# Patient Record
Sex: Male | Born: 1950 | Race: White | Hispanic: No | Marital: Married | Smoking: Never smoker
Health system: Southern US, Community
[De-identification: ages and names within clinical notes are randomized; demographics above are authoritative.]

## PROBLEM LIST (undated history)

## (undated) DIAGNOSIS — E7800 Pure hypercholesterolemia, unspecified: Secondary | ICD-10-CM

## (undated) DIAGNOSIS — E119 Type 2 diabetes mellitus without complications: Secondary | ICD-10-CM

## (undated) DIAGNOSIS — I10 Essential (primary) hypertension: Secondary | ICD-10-CM

## (undated) HISTORY — PX: WISDOM TOOTH EXTRACTION: SHX21

---

## 2004-05-24 ENCOUNTER — Ambulatory Visit: Payer: Self-pay | Admitting: Otolaryngology

## 2004-05-28 ENCOUNTER — Ambulatory Visit: Payer: Self-pay | Admitting: Otolaryngology

## 2004-06-14 ENCOUNTER — Ambulatory Visit: Payer: Self-pay | Admitting: Surgery

## 2004-06-23 ENCOUNTER — Ambulatory Visit: Payer: Self-pay | Admitting: Otolaryngology

## 2006-07-12 ENCOUNTER — Emergency Department: Payer: Self-pay | Admitting: Emergency Medicine

## 2011-09-18 ENCOUNTER — Ambulatory Visit: Payer: Self-pay | Admitting: Surgery

## 2012-08-21 ENCOUNTER — Ambulatory Visit: Payer: Self-pay | Admitting: Family Medicine

## 2012-08-24 ENCOUNTER — Ambulatory Visit: Payer: Self-pay | Admitting: Specialist

## 2012-08-27 ENCOUNTER — Inpatient Hospital Stay: Payer: Self-pay | Admitting: Internal Medicine

## 2012-08-27 LAB — URINALYSIS, COMPLETE
Bacteria: NONE SEEN
Bilirubin,UR: NEGATIVE
Blood: NEGATIVE
Ketone: NEGATIVE
Nitrite: NEGATIVE
Ph: 5 (ref 4.5–8.0)
Protein: 100
Specific Gravity: 1.03 (ref 1.003–1.030)
WBC UR: 5 /HPF (ref 0–5)

## 2012-08-27 LAB — BASIC METABOLIC PANEL
Anion Gap: 6 — ABNORMAL LOW (ref 7–16)
BUN: 18 mg/dL (ref 7–18)
Calcium, Total: 9.3 mg/dL (ref 8.5–10.1)
Chloride: 99 mmol/L (ref 98–107)
Creatinine: 1.14 mg/dL (ref 0.60–1.30)
EGFR (African American): >60
EGFR (Non-African Amer.): >60
Glucose: 198 mg/dL — ABNORMAL HIGH (ref 65–99)
Osmolality: 274 (ref 275–301)
Sodium: 133 mmol/L — ABNORMAL LOW (ref 136–145)

## 2012-08-27 LAB — CBC WITH DIFFERENTIAL/PLATELET
Basophil #: 0.1 10*3/uL (ref 0.0–0.1)
Basophil %: 0.6 %
Eosinophil %: 0.1 %
HCT: 37.9 % — ABNORMAL LOW (ref 40.0–52.0)
HGB: 13.2 g/dL (ref 13.0–18.0)
Lymphocyte #: 1.4 10*3/uL (ref 1.0–3.6)
Lymphocyte %: 9.6 %
Monocyte #: 0.9 x10 3/mm (ref 0.2–1.0)
Monocyte %: 6.3 %
Neutrophil #: 12.5 10*3/uL — ABNORMAL HIGH (ref 1.4–6.5)
Neutrophil %: 83.4 %
RBC: 4.53 10*6/uL (ref 4.40–5.90)
WBC: 15 10*3/uL — ABNORMAL HIGH (ref 3.8–10.6)

## 2012-08-27 LAB — HEMOGLOBIN A1C: Hemoglobin A1C: 7.2 % — ABNORMAL HIGH (ref 4.2–6.3)

## 2012-08-29 LAB — BASIC METABOLIC PANEL
BUN: 18 mg/dL (ref 7–18)
Chloride: 104 mmol/L (ref 98–107)
Co2: 26 mmol/L (ref 21–32)
EGFR (African American): >60
Glucose: 293 mg/dL — ABNORMAL HIGH (ref 65–99)
Osmolality: 287 (ref 275–301)
Sodium: 137 mmol/L (ref 136–145)

## 2012-08-29 LAB — CBC WITH DIFFERENTIAL/PLATELET
Basophil #: 0 10*3/uL (ref 0.0–0.1)
Basophil %: 0.2 %
HCT: 35.2 % — ABNORMAL LOW (ref 40.0–52.0)
HGB: 12.3 g/dL — ABNORMAL LOW (ref 13.0–18.0)
Lymphocyte #: 0.8 10*3/uL — ABNORMAL LOW (ref 1.0–3.6)
MCH: 29.3 pg (ref 26.0–34.0)
Monocyte %: 5.8 %
Neutrophil %: 87.9 %
Platelet: 277 10*3/uL (ref 150–440)
RDW: 14.1 % (ref 11.5–14.5)
WBC: 12.4 10*3/uL — ABNORMAL HIGH (ref 3.8–10.6)

## 2012-08-30 LAB — EXPECTORATED SPUTUM ASSESSMENT W GRAM STAIN, RFLX TO RESP C

## 2012-09-02 LAB — CULTURE, BLOOD (SINGLE)

## 2012-09-16 ENCOUNTER — Ambulatory Visit: Payer: Self-pay | Admitting: Family Medicine

## 2012-09-18 ENCOUNTER — Ambulatory Visit: Payer: Self-pay | Admitting: Specialist

## 2012-11-28 ENCOUNTER — Ambulatory Visit: Payer: Self-pay

## 2013-02-16 ENCOUNTER — Ambulatory Visit: Payer: Self-pay | Admitting: Internal Medicine

## 2013-08-01 ENCOUNTER — Ambulatory Visit: Payer: Self-pay | Admitting: Internal Medicine

## 2014-01-10 IMAGING — CR DG CHEST 2V
1 series · 2 of 2 positions shown · non-contrast
Comparison: none

REASON FOR EXAM: pneumonia
COMMENTS:

PROCEDURE:     KDR - KDXR CHEST PA (OR AP) AND LAT  - September 18, 2012 [DATE]
RESULT:     Comparison: 08/27/2012

[Series 1: pa · 0.17mm/px · 2 of 2 slices shown]
[im 1/2]
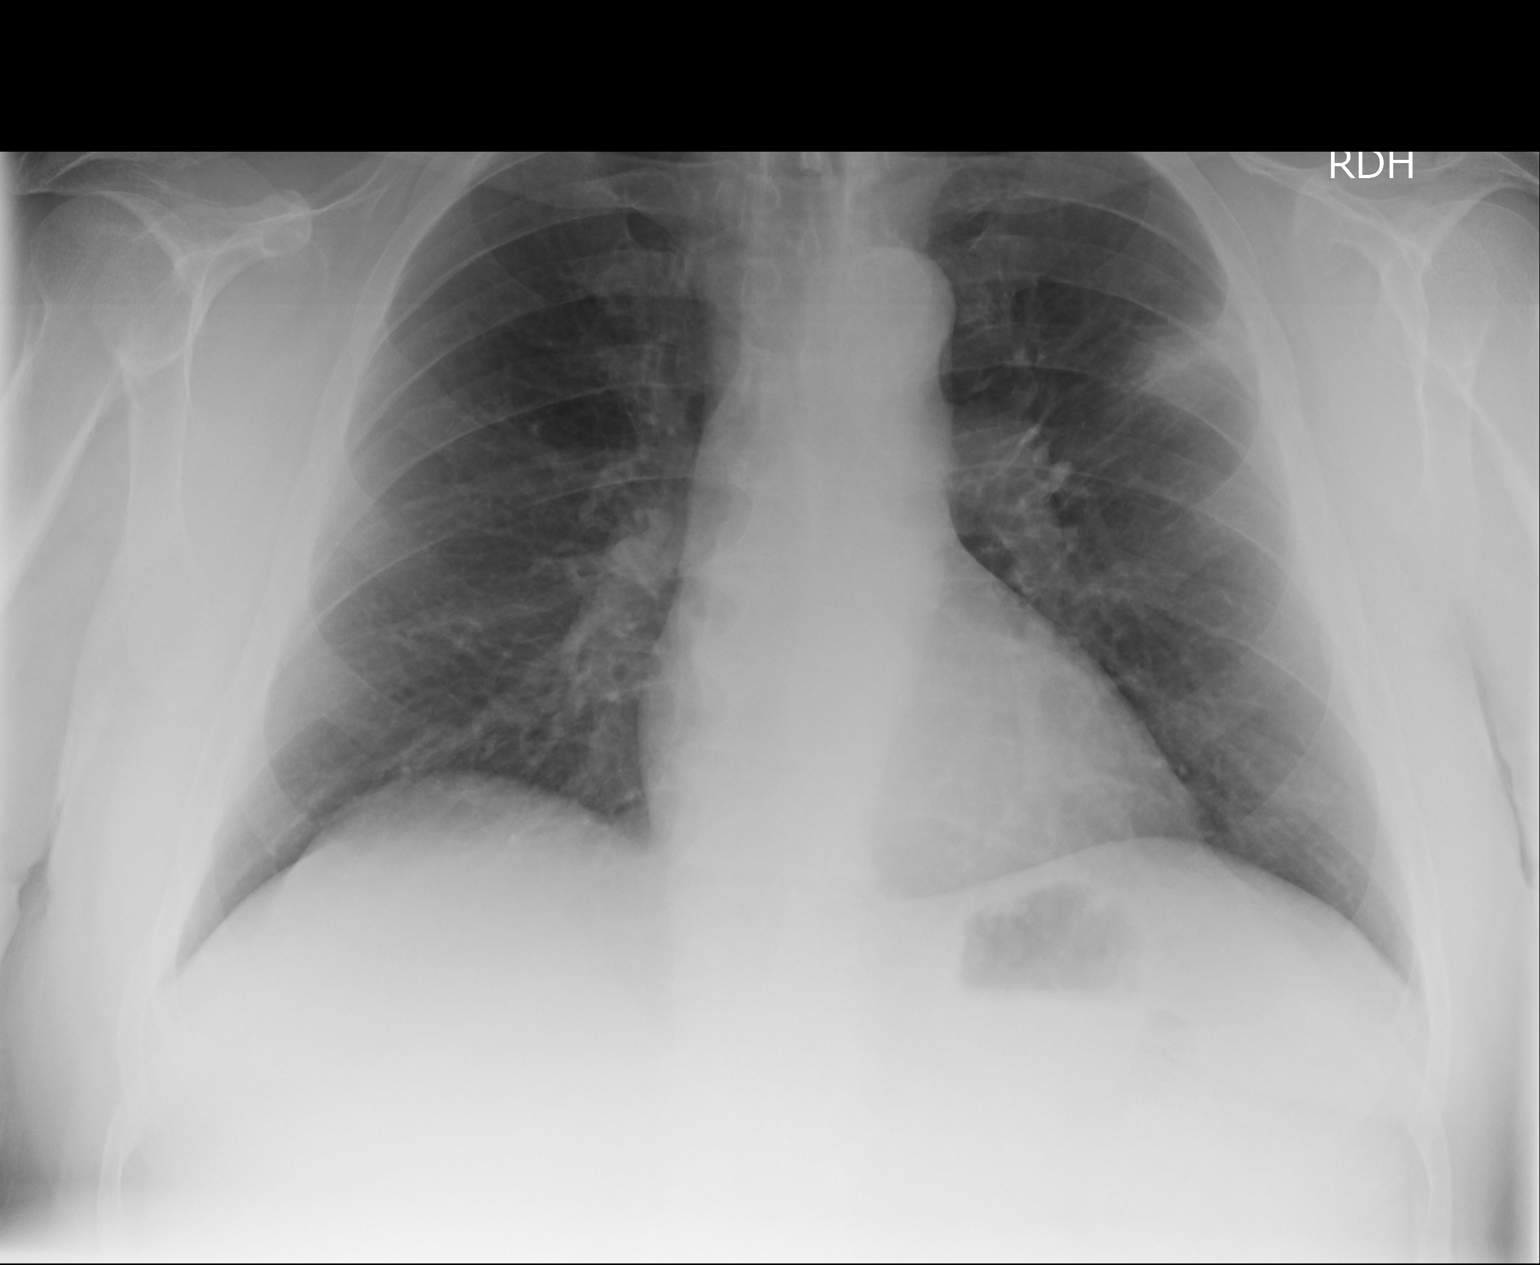
[im 2/2]
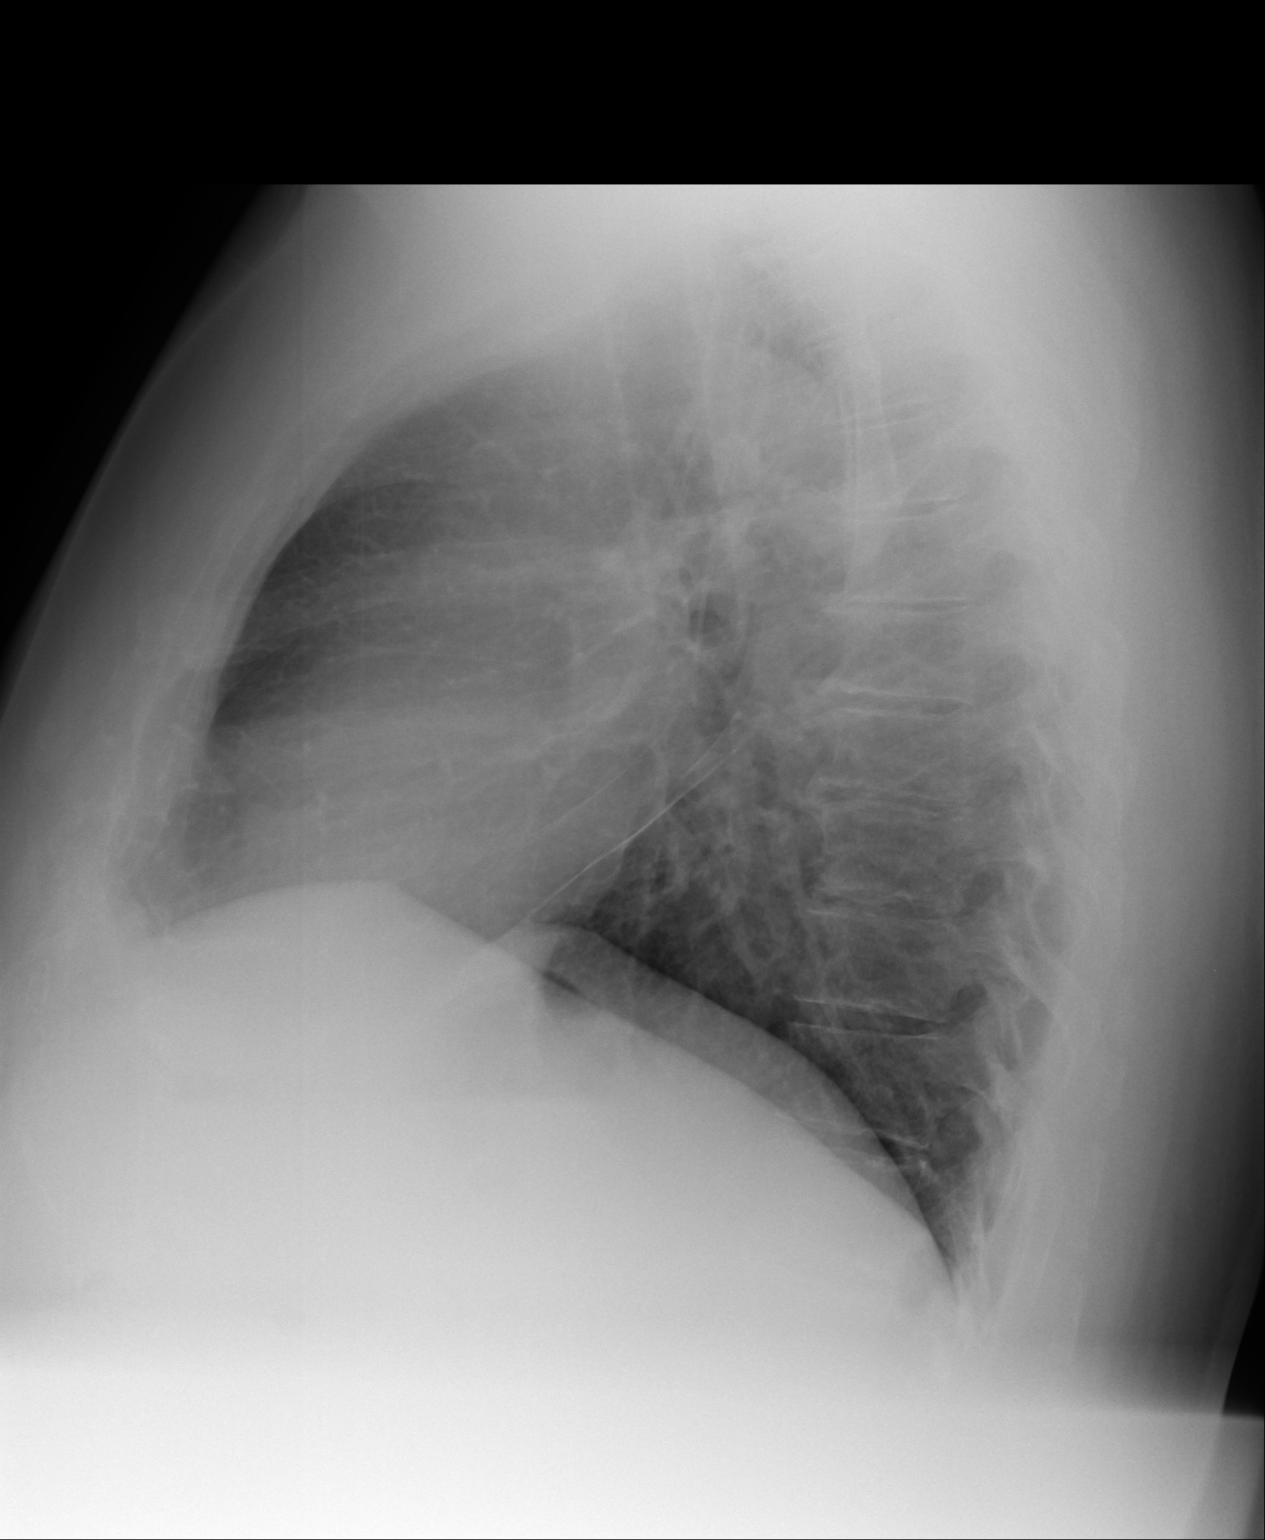

[2 of 2 positions shown; findings below may reference images not displayed]

FINDINGS: PA and lateral chest radiographs are provided. There is persistent
peripheral left upper lobe airspace disease which has decreased in size
compared with the prior examination. There is no other focal parenchymal
opacity, pleural effusion, or pneumothorax. The heart and mediastinum are
unremarkable.  The osseous structures are unremarkable.
IMPRESSION: Persistent, but improved, left upper lobe peripheral airspace disease. This
likely reflects resolving pneumonia. Recommend followup radiography to
document complete resolution following adequate medical therapy. If there is
not complete resolution, then recommend further evaluation with CT of the
chest to exclude underlying pathology.

[REDACTED]

## 2014-05-09 ENCOUNTER — Ambulatory Visit: Payer: Self-pay

## 2014-08-19 NOTE — Consult Note (Signed)
Impression:    63yo male w/ h/o DM and sleep apnea admitted with pneumonia.     He has been in and out of the office for the past week.  He had a negative CXR several days ago, but now his CXR shows an infiltrate.  His CT shows no evidence for PE.  He is feeling somewhat better.  Still with a bit of wheezing.    Continue steroids.  Would wean when possible.    He is on ceftriaxone and levofloxacin.  The latter would be adequate to cover CAP.    Will try to get sputum culture.  Electronic Signatures: Jeslin Bazinet MPH, Heinz Knuckles (MD)  (Signed on 02-May-14 11:15)  Authored  Last Updated: 02-May-14 11:15 by Shyteria Lewis MPH, Heinz Knuckles (MD)

## 2014-08-19 NOTE — H&P (Signed)
PATIENT NAME:  Stanley Myers, Stanley Myers MR#:  [id number] DATE OF BIRTH:  1950/08/30  DATE OF ADMISSION:  08/27/2012  ADMITTING PHYSICIAN: Gladstone Lighter, MD  PRIMARY CARE PHYSICIAN: Lanae Boast, MD   CHIEF COMPLAINT: Difficulty breathing.   HISTORY OF PRESENT ILLNESS: The patient  is a very pleasant 64 year old Caucasian male with past medical history significant for obesity, obstructive sleep apnea, hyperlipidemia, hypertension and diabetes mellitus, who has been having upper respiratory tract infection symptoms for the past 10 days.  He went to urgent care last week because of nonresolving postnasal drip with cough and difficulty breathing and was discharged on an Albuterol inhaler and cough medicine. Since he was not getting better, he visited his PCP, who was started him on p.o. prednisone, added some nebs instead of the inhaler and also added an antibiotic. He went for follow up today because his symptoms were not getting better, and he also developed severe left-sided pleuritic chest plain and was very dyspneic, continued to have cough and also fevers as high as 103 Fahrenheit yesterday at home and so was referred to the hospital.   PAST MEDICAL HISTORY: 1.  Obstructive sleep apnea.  2.  Hyperlipidemia.  3.  Osteoarthritis.  4.  Type 2 diabetes mellitus.  5.  Hypertension.  6.  Allergic rhinitis.  7.  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  8.  Erectile dysfunction.   PAST SURGICAL HISTORY: 1.  Skin pre-malignant lesion excision. 2.  Colonoscopy.   ALLERGIES:  INTOLERANT TO LIPITOR.   HOME MEDICATIONS:  1. Albuterol nebulizer q. 6 hours p.r.n. started 2 days ago.  2. Aspirin 81 mg p.o. daily.  3. Antibiotic Cedax 400 mg capsule once a day started 2 days ago.  4. Celebrex 200 mg b.i.d. as needed for arthritis pain.  5. Fenofibrate 145 mg p.o. daily.  6. Glipizide 5 mg p.o. daily.  7. Losartan HCTZ 100/25 mg 1 tablet p.o. daily.  8. Metformin 1000 mg p.o. b.i.d.  9. Singulair 10 mg  p.o. daily.  10. Prednisone taper, currently at 30 mg, again started 2 days ago.  11. Crestor 10 mg p.o. daily.  12. Cialis 5 mg p.o. daily.  13. Ultracet 37.5/325 mg 1 tablet every 6 hours as needed for pain.   SOCIAL HISTORY: Lives at home with his wife. No smoking.  No alcohol use. Currently retired.   FAMILY HISTORY: Mother had history of breast cancer but died in her 87s secondary to sepsis.  Father died in 63s from stroke.   REVIEW OF SYSTEMS:   CONSTITUTIONAL: Positive for fever, fatigue and weakness.  EYES: No blurred vision, double vision, glaucoma, inflammation or cataracts.  ENT: Positive for right ear hearing loss. No tinnitus. No discharge.  Positive for postnasal drip. No epistaxis.  RESPIRATORY: Positive for cough, wheeze.  No hemoptysis. No chronic obstructive pulmonary disease.  CARDIOVASCULAR:  Positive for pleuritic left-sided chest pain, dyspnea on exertion, no palpitations or syncope.  GASTROINTESTINAL: No nausea, vomiting, diarrhea, abdominal pain, hematemesis or melena.  GENITOURINARY: No dysuria, hematuria, renal calculus, frequency or incontinence.  ENDOCRINE: No polyuria, nocturia, thyroid problems, heat or cold intolerance.  HEMATOLOGY: No anemia, easy bruising or bleeding.  SKIN: No acne, rash or lesions.  MUSCULOSKELETAL: No neck, back, shoulder pain, arthritis or gout.  NEUROLOGIC: No numbness, weakness, CVA, TIA or seizures.   PSYCHOLOGICAL: No anxiety, insomnia, depression.   PHYSICAL EXAMINATION: VITAL SIGNS: Temperature 97.9 degrees Fahrenheit, pulse 86, respirations 17, blood pressure 126/77, pulse of 98% on room air.  GENERAL: Heavily built, well-nourished male sitting in chair, not in any acute distress. Unable to lie flat for his CAT scan due to dyspnea.  HEENT: Normocephalic, atraumatic. Pupils are equal, round, reacting to light. Anicteric sclerae. Extraocular movements intact. Oropharynx clear without erythema, mass or exudates.  NECK: Supple. No  thyromegaly, JVD or carotid bruits. No lymphadenopathy.  LUNGS: Moving air bilaterally but diffuse inspiratory-expiratory wheeze, more prominent on the left side. There are no crackles. Use of accessory muscles on minimal exertion.  CARDIOVASCULAR: S1, S2, regular rate and rhythm. No murmurs, rubs or gallops.  ABDOMEN: Obese, nontender, nondistended. No hepatosplenomegaly.  Normal bowel sounds.  EXTREMITIES: No pedal edema. No clubbing or cyanosis, 2+ dorsalis pedis pulses palpable bilaterally.  SKIN: No acne, rash or lesions.  LYMPHATICS: No cervical lymphadenopathy.  NEUROLOGIC: Cranial nerves intact. No focal motor or sensory deficits.  PSYCHOLOGICAL: The patient is awake, alert, oriented x 3.   LABORATORY AND RADIOLOGICAL DATA:  WBC 15.0, hemoglobin 13.3, hematocrit 37.9, platelet count 218.  Sodium 132, potassium 3.6, chloride 99, bicarbonate 28, BUN 18, creatinine 1.14, glucose of 198 and calcium of 9.3. Urinalysis negative for any infection.  Chest x-ray is done and pending at this time.   Hemoglobin A1c  is elevated at 7.2.  His chest x-ray done on 08/25/2011 is showing increased perihilar markings, could be atelectasis, also seen in bronchitis.   ASSESSMENT AND PLAN: A 64 year old male with history of sinus problems, hypertension, diabetes and hyperlipidemia, sent in from Dr. Fabio Asa office for pleuritic chest pain and worsening dyspnea for 1 week now.   1.  Acute dyspnea with pleuritic chest pain: Likely secondary to underlying pneumonia which probably could have worsened from his bronchitis that started last week. He failed outpatient treatment. Chest x-ray from today is pending. Chest x-ray from 3 days ago did not show any changes at the time.  Blood cultures are drawn, started on Rocephin and Levaquin.  CT chest as recommended by Dr. Clayborn Bigness on tomorrow morning, as currently he cannot lay flat. He also has significant reactive disease with wheezing on exam, so we will start  Solu-Medrol and also nebs for now.  He has a history of hallucinations with steroids in the past, so continue to monitor this.  2.  Hypertension: Hold his Hyzaar for now as he will get contact CT and gentle hydration.  3.  Diabetes mellitus: Continue glipizide and sliding scale insulin. Again, hold metformin as he will get CT in the a.m.  4.  Hyperlipidemia:  Continue home medications.  5.  Gastrointestinal and deep vein thrombosis prophylaxis:  The patient is on Protonix and Lovenox.   CODE STATUS:  FULL CODE.     TIME SPENT ON ADMISSION: 50 minutes.   ____________________________ Gladstone Lighter, MD rk:cb D: 08/27/2012 21:59:43 ET T: 08/27/2012 22:14:10 ET JOB#: [id number]  cc: Gladstone Lighter, MD, <Dictator> Heinz Knuckles. Blocker, MD Gladstone Lighter MD ELECTRONICALLY SIGNED 08/28/2012 20:09

## 2014-08-19 NOTE — Consult Note (Signed)
PATIENT NAME:  Stanley Myers, Stanley Myers MR#:  [id number] DATE OF BIRTH:  1951-03-12  DATE OF CONSULTATION:  08/28/2012  REFERRING PHYSICIAN:  Demetrios Loll, MD  CONSULTING PHYSICIAN:  Heinz Knuckles. Eston Heslin, MD  REASON FOR CONSULTATION:  Pneumonia.   HISTORY OF PRESENT ILLNESS:  The patient is a 64 year old white male with a past history significant for diabetes and sleep apnea, who was admitted yesterday with progressive shortness of breath and wheezing. The patient initially developed his cough approximately 2 weeks ago. He was seen in Urgent Care initially and was given albuterol presumably for a viral illness. He had some nasal congestion, postnasal drip, rhinorrhea and sore throat as well as shortness of breath and wheezing at that time. He did not have significant fevers, chills, or sweats initially. He was seen by me in the office on 04/28 as he had not gotten significantly better. A chest x-ray was obtained which showed no evidence for infiltrate. He was given a 60 mg taper of prednisone over 6 days. He represented the following day feeling that wheezing had gotten worse and clinically he had more wheezing on exam. He had a low peak flow, but he improved with nebulizer treatment. He was sent home on Singulair and given albuterol for a nebulizer as well as continued prednisone taper. He was also given ceftibuten to start if he did not get better over the next few days. On 04/30, he called that he was still having fevers and started the ceftibuten. I saw him again yesterday in the office and while his wheezing was somewhat better, he had developed pleuritic chest pain on the left and this prompted having him admitted to the hospital. He has been started on ceftriaxone and levofloxacin as well as IV steroids. He states that his breathing is better. His chest pain is somewhat better. He has not been febrile since admission.   ALLERGIES:  LIPITOR WHICH CAUSES MUSCLE ACHES.   PAST MEDICAL HISTORY:  1.  Sleep apnea.   2.  Mixed hypercholesterolemia.  3.  Osteoarthritis.  4.  Type 2 diabetes.  5.  Allergic rhinitis.  6.  Hypertension.  7.  Hearing loss.   8.  Obesity.  9.  Erectile dysfunction.   FAMILY HISTORY:  Positive for coronary artery disease but there is no diabetes or hypertension in the family.   SOCIAL HISTORY:  The patient lives with his wife. He has never been a smoker. He drinks alcohol occasionally. No history of injecting drug use. He has a dog at home.  REVIEW OF SYSTEMS:  GENERAL:  He has had fevers and some chills, sweats, and malaise over the last few days. This has been better since being in the hospital.  HEENT:  No headaches, positive sinus congestion and nasal congestion. Some occasional sore throat.  NECK:  No stiffness. No swollen glands.  RESPIRATORY:  Positive cough with sputum production. No hemoptysis. Positive wheezing.  CARDIAC:  He developed the pleuritic chest pain over the last 24 to 48 hours. This has improved. He has had no peripheral edema. No palpitations.  GASTROINTESTINAL:  No nausea, no vomiting, no abdominal pain, no change in his bowels.  GENITOURINARY:  No change in his urine.  MUSCULOSKELETAL:  No pains other than the pleuritic chest pains, no joint inflammation.  SKIN:  No rashes.  NEUROLOGIC:  No focal weakness.  PSYCHIATRIC:  No complaints. All other systems are negative.   PHYSICAL EXAMINATION:  VITAL SIGNS:  T-max of 97.9, T-current of 97.8, pulse of  81, blood pressure 143/79, 97% on room air.  GENERAL:  A 64 year old, mildly obese, white man in no acute distress.  HEENT:  Normocephalic, atraumatic. Pupils equal, reactive to light. Extraocular motion intact. Sclerae, conjunctivae, and lids without evidence for emboli or petechiae. Oropharynx shows no erythema or exudate. Teeth and gums are in fair condition.  NECK:  Supple. Full range of motion. Midline trachea. No lymphadenopathy. No thyromegaly. CHEST:  He had bilateral wheezing in the bases.  This was much better then over the last week. He was able to speak in full sentences and was not particularly tachypneic.  CARDIAC:  Regular rate and rhythm without murmur, rub, or gallop.  ABDOMEN:  Soft, nontender, nondistended. No hepatosplenomegaly. No hernias noted.  EXTREMITIES:  No evidence for tenosynovitis.  SKIN:  No rashes. No stigmata of endocarditis, specifically, no Janeway lesions or Osler nodes.  NEUROLOGIC:  The patient is awake and interactive, moving all 4 extremities.  PSYCHIATRIC:  Mood and affect appeared normal.   LABORATORY DATA:  BUN of 18, creatinine 1.14, bicarbonate of 28, anion gap of 6. White count of 15.0 with a hemoglobin 13.2, platelet count of 218, ANC of 12.5. Blood cultures from admission show no growth to date. Urinalysis had greater than 500 glucose, negative nitrites, negative leukocyte esterase. A chest x-ray from 04/28 showed no focal pneumonia. A chest x-ray from admission showed a new parenchymal consolidation peripherally in the left mid to upper lung suggestive of pneumonia. A CT scan of the chest for PE showed left lower lobe infiltrate, but no evidence for pulmonary embolism.   IMPRESSION:  A 64 year old male with a history of diabetes and sleep apnea, admitted with pneumonia.   RECOMMENDATIONS:  1.  He has been in and out of the office for the past week. He had a negative chest x-ray several days ago but now his chest x-ray shows an infiltrate. His CT shows no evidence for pulmonary embolism. He is feeling much better. He still has a bit of wheezing.  2.  We will continue to use steroids. Would wean when possible.  3.  He is on ceftriaxone and levofloxacin. The latter will be adequate to cover community-acquired pneumonia. We will stop the ceftriaxone.  4.  We will try to get a sputum culture.   This is a moderately complex Infectious Disease case. Thank you very much for involving me in this patient's care.  ____________________________ Heinz Knuckles.  Delayni Streed, MD meb:jm D: 08/28/2012 11:23:24 ET T: 08/28/2012 12:08:08 ET JOB#: [id number]  cc: Heinz Knuckles. Carmichael Burdette, MD, <Dictator> Cythina Mickelsen E Arthuro Canelo MD ELECTRONICALLY SIGNED 08/31/2012 10:07

## 2014-08-19 NOTE — Discharge Summary (Signed)
PATIENT NAME:  Stanley Myers, Stanley Myers MR#:  [id number] DATE OF BIRTH:  08/02/1950  DISCHARGE SUMMARY ADDENDUM  DATE OF ADMISSION:  08/27/2012 DATE OF DISCHARGE:  08/29/2012  Cultures actually came back as Staphylococcus aureus, not sensitive to Levaquin, but sensitive to Bactrim, so the patient is being discharged with Bactrim 1 tablet p.o. b.i.d. x 7 days of Bactrim, double-strength 1 tablet p.o. b.i.d. x 7 days.   ____________________________ Donell Beers. Benjie Karvonen, MD spm:dm D: 08/30/2012 12:44:01 ET T: 08/30/2012 15:23:04 ET JOB#: [id number]  cc: Chayne Baumgart P. Benjie Karvonen, MD, <Dictator> [place] Blocker, MD Jaylen Claude P Kadence Mikkelson MD ELECTRONICALLY SIGNED 08/31/2012 15:23

## 2014-08-19 NOTE — Discharge Summary (Signed)
PATIENT NAME:  Stanley Myers, Stanley Myers MR#:  [id number] DATE OF BIRTH:  Dec 31, 1950  DATE OF ADMISSION:  08/27/2012 DATE OF DISCHARGE:  08/29/2012  ADMISSION DIAGNOSIS:  Community acquired pneumonia.   DISCHARGE DIAGNOSES:  1.  Community-acquired pneumonia.  2.  Hypertension.  3.  Diabetes.   CONSULTS: Dr. Clayborn Bigness.   PERTINENT LABORATORY AND DIAGNOSTIC DATA AT DISCHARGE:  Blood cultures were negative to date. White blood cells 12.4, hemoglobin 12.3, hematocrit 35.2, platelets 277. Sodium 137, potassium 4.5, chloride 104, bicarbonate 26, BUN 18, creatinine 1.13 glucose 293.   HOSPITAL COURSE: The patient is a 64 year old male who presented with fever and cough.  For further details, please refer to the history and physical.   1.  Community acquired pneumonia. The patient had a chest x-ray, which was consistent with a left lower lobe infiltrate. He actually was started on Levaquin. He is much improved. He has been afebrile. Sputum culture is pending and blood cultures are negative to date.  2.  Hypertension. The patient may resume his home medications.   3.  Diabetes: The patient will continue his home medications and ADA diet.   DISCHARGE MEDICATIONS: 1.  Glipizide 5 mg daily.  2.  Metformin 1000 mg b.i.d.  3.  Beclomethasone 80 mcg inhalation spray 2 sprays daily.  4.  Celebrex 200 mg b.i.d.  5.  Fenofibrate 145 daily.  6.  Rosuvastatin 10 mg at bedtime.  7.  Cialis orally as needed. 8.  Hyzaar 25/100 one tablet daily.  9.  Fexofenadine pseudoephedrine 60/120, 1 tablet q.12 hours p.r.n.  10.  Albuterol 2 puffs inhaled as needed.  11.  Levaquin 750 mg daily for 5 days.  12.  The patient will resume his outpatient steroid taper of prednisone 30 mg.   DISCHARGE DIET: Low sodium, carbohydrate diet, ADA.   DISCHARGE ACTIVITY: As tolerated.   DISCHARGE FOLLOW UP:  in 1 to 2 weeks with Dr. Clayborn Bigness  TIME SPENT: Approximately 35 minutes.    ____________________________ Donell Beers. Benjie Karvonen,  MD spm:ct D: 08/29/2012 13:10:14 ET T: 08/29/2012 15:21:52 ET JOB#: [id number]  cc: Haddon Fyfe P. Benjie Karvonen, MD, <Dictator> [place] Blocker, MD Renly Guedes P Ivie Savitt MD ELECTRONICALLY SIGNED 08/30/2012 13:02

## 2014-08-21 NOTE — Op Note (Signed)
PATIENT NAME:  Stanley Myers, Stanley Myers MR#:  [id number] DATE OF BIRTH:  10-09-1950  DATE OF PROCEDURE:  09/18/2011  PREOPERATIVE DIAGNOSIS: Family history of colon cancer.   POSTOPERATIVE DIAGNOSIS: Family history of colon cancer, single diverticulum, hemorrhoids.   PROCEDURE: Colonoscopy.   SURGEON: Rochel Brome, MD  ANESTHESIA: Intravenous sedation and monitored anesthesia care.   INDICATIONS: This 64 year old male came into the office for follow-up colonoscopy. He had a colonoscopy in February 2006. There was a hyperplastic polyp found. He does have family history of colon cancer in his maternal grandfather who died at the age of 80 from colon cancer.   Colonoscopy is recommended for screening.   DESCRIPTION OF PROCEDURE: The patient was placed on the stretcher in the left lateral decubitus position and sedated by the anesthesia staff. The Olympus video colonoscope was inserted and manipulated around to the cecum which was identified by the ileocecal valve and the scope was advanced up into the last 2 inches of terminal ileum, which had typical small bowel mucosa. The scope was pulled back into the cecum. Colon preparation was good. A small amount of bilious feculent material was aspirated during the procedure. The scope was gradually pulled back viewing the colonic mucosa seeing no polyps or tumors. There was a single diverticulum in the descending colon. The scope was further pulled back down through the sigmoid colon and into the rectum. There was some prominence of hemorrhoid vessels. The scope was retroflexed to view the distal rectum and again demonstrated prominence of hemorrhoidal vessels. The scope was again straightened, air was aspirated, and the scope was withdrawn.   Digital anorectal exam did demonstrate some enlargement of external hemorrhoids and there was no palpable rectal mass.    The patient tolerated the procedure satisfactorily and was then prepared for transfer to the recovery  room.  ____________________________ Lenna Sciara. Rochel Brome, MD jws:slb D: 09/18/2011 08:35:26 ET T: 09/18/2011 10:17:49 ET JOB#: [id number]  cc: Loreli Dollar, MD, <Dictator> Loreli Dollar MD ELECTRONICALLY SIGNED 09/23/2011 11:13

## 2015-12-11 DIAGNOSIS — D239 Other benign neoplasm of skin, unspecified: Secondary | ICD-10-CM

## 2015-12-11 HISTORY — DX: Other benign neoplasm of skin, unspecified: D23.9

## 2015-12-21 ENCOUNTER — Ambulatory Visit (INDEPENDENT_AMBULATORY_CARE_PROVIDER_SITE_OTHER): Payer: Medicare Other

## 2015-12-21 ENCOUNTER — Emergency Department: Payer: Medicare Other

## 2015-12-21 ENCOUNTER — Encounter: Payer: Self-pay | Admitting: Emergency Medicine

## 2015-12-21 ENCOUNTER — Encounter: Payer: Self-pay | Admitting: *Deleted

## 2015-12-21 ENCOUNTER — Ambulatory Visit (INDEPENDENT_AMBULATORY_CARE_PROVIDER_SITE_OTHER)
Admission: EM | Admit: 2015-12-21 | Discharge: 2015-12-21 | Disposition: A | Discharge status: Home / Self Care | Payer: Medicare Other | Source: Home / Self Care | Attending: Family Medicine | Admitting: Family Medicine

## 2015-12-21 DIAGNOSIS — E119 Type 2 diabetes mellitus without complications: Secondary | ICD-10-CM | POA: Insufficient documentation

## 2015-12-21 DIAGNOSIS — R1013 Epigastric pain: Secondary | ICD-10-CM | POA: Diagnosis not present

## 2015-12-21 DIAGNOSIS — Z79899 Other long term (current) drug therapy: Secondary | ICD-10-CM | POA: Diagnosis not present

## 2015-12-21 DIAGNOSIS — R079 Chest pain, unspecified: Secondary | ICD-10-CM | POA: Diagnosis present

## 2015-12-21 DIAGNOSIS — R12 Heartburn: Secondary | ICD-10-CM

## 2015-12-21 DIAGNOSIS — Z7984 Long term (current) use of oral hypoglycemic drugs: Secondary | ICD-10-CM | POA: Insufficient documentation

## 2015-12-21 DIAGNOSIS — E785 Hyperlipidemia, unspecified: Secondary | ICD-10-CM | POA: Insufficient documentation

## 2015-12-21 DIAGNOSIS — I10 Essential (primary) hypertension: Secondary | ICD-10-CM | POA: Insufficient documentation

## 2015-12-21 DIAGNOSIS — Z7982 Long term (current) use of aspirin: Secondary | ICD-10-CM | POA: Insufficient documentation

## 2015-12-21 DIAGNOSIS — J40 Bronchitis, not specified as acute or chronic: Secondary | ICD-10-CM | POA: Diagnosis not present

## 2015-12-21 DIAGNOSIS — R109 Unspecified abdominal pain: Secondary | ICD-10-CM | POA: Diagnosis not present

## 2015-12-21 HISTORY — DX: Type 2 diabetes mellitus without complications: E11.9

## 2015-12-21 LAB — BASIC METABOLIC PANEL
ANION GAP: 10 (ref 5–15)
BUN: 12 mg/dL (ref 6–20)
CALCIUM: 9 mg/dL (ref 8.9–10.3)
CO2: 22 mmol/L (ref 22–32)
Chloride: 106 mmol/L (ref 101–111)
Creatinine, Ser: 1.07 mg/dL (ref 0.61–1.24)
GFR calc Af Amer: >60 mL/min (ref >60)
GFR calc non Af Amer: >60 mL/min (ref >60)
GLUCOSE: 160 mg/dL — AB (ref 65–99)
POTASSIUM: 3.6 mmol/L (ref 3.5–5.1)
Sodium: 138 mmol/L (ref 135–145)

## 2015-12-21 LAB — CBC WITH DIFFERENTIAL/PLATELET
BASOS ABS: 0.1 10*3/uL (ref 0–0.1)
BASOS PCT: 1 %
EOS PCT: 1 %
Eosinophils Absolute: 0.1 10*3/uL (ref 0–0.7)
HEMATOCRIT: 43 % (ref 40.0–52.0)
Hemoglobin: 14.5 g/dL (ref 13.0–18.0)
Lymphocytes Relative: 14 %
Lymphs Abs: 1.5 10*3/uL (ref 1.0–3.6)
MCH: 29.3 pg (ref 26.0–34.0)
MCHC: 33.7 g/dL (ref 32.0–36.0)
MCV: 87 fL (ref 80.0–100.0)
MONO ABS: 0.9 10*3/uL (ref 0.2–1.0)
MONOS PCT: 8 %
NEUTROS ABS: 8.3 10*3/uL — AB (ref 1.4–6.5)
NEUTROS PCT: 76 %
PLATELETS: 174 10*3/uL (ref 150–440)
RBC: 4.94 MIL/uL (ref 4.40–5.90)
RDW: 14.2 % (ref 11.5–14.5)
WBC: 10.9 10*3/uL — AB (ref 3.8–10.6)

## 2015-12-21 LAB — COMPREHENSIVE METABOLIC PANEL
ALBUMIN: 4.5 g/dL (ref 3.5–5.0)
ALT: 15 U/L — ABNORMAL LOW (ref 17–63)
ANION GAP: 9 (ref 5–15)
AST: 15 U/L (ref 15–41)
Alkaline Phosphatase: 43 U/L (ref 38–126)
BILIRUBIN TOTAL: 1.4 mg/dL — AB (ref 0.3–1.2)
BUN: 11 mg/dL (ref 6–20)
CO2: 24 mmol/L (ref 22–32)
Calcium: 9 mg/dL (ref 8.9–10.3)
Chloride: 105 mmol/L (ref 101–111)
Creatinine, Ser: 1.16 mg/dL (ref 0.61–1.24)
Glucose, Bld: 177 mg/dL — ABNORMAL HIGH (ref 65–99)
POTASSIUM: 3.8 mmol/L (ref 3.5–5.1)
Sodium: 138 mmol/L (ref 135–145)
TOTAL PROTEIN: 6.8 g/dL (ref 6.5–8.1)

## 2015-12-21 LAB — TROPONIN I
Troponin I: <0.03 ng/mL (ref <0.03)
Troponin I: <0.03 ng/mL (ref <0.03)

## 2015-12-21 LAB — CK: CK TOTAL: 74 U/L (ref 49–397)

## 2015-12-21 LAB — CBC
HEMATOCRIT: 42.2 % (ref 40.0–52.0)
HEMOGLOBIN: 14.8 g/dL (ref 13.0–18.0)
MCH: 30.4 pg (ref 26.0–34.0)
MCHC: 35.1 g/dL (ref 32.0–36.0)
MCV: 86.8 fL (ref 80.0–100.0)
Platelets: 188 10*3/uL (ref 150–440)
RBC: 4.87 MIL/uL (ref 4.40–5.90)
RDW: 14.4 % (ref 11.5–14.5)
WBC: 12.9 10*3/uL — ABNORMAL HIGH (ref 3.8–10.6)

## 2015-12-21 LAB — LIPASE, BLOOD: Lipase: 29 U/L (ref 11–51)

## 2015-12-21 LAB — AMYLASE: AMYLASE: 25 U/L — AB (ref 28–100)

## 2015-12-21 LAB — CKMB (ARMC ONLY): CK, MB: 1.6 ng/mL (ref 0.5–5.0)

## 2015-12-21 MED ORDER — GI COCKTAIL ~~LOC~~
30.0000 mL | Freq: Once | ORAL | Status: AC
  Administered 2015-12-21: 30 mL via ORAL

## 2015-12-21 MED ORDER — SUCRALFATE 1 G PO TABS: 2.0000 g | ORAL_TABLET | Freq: Two times a day (BID) | ORAL | 0 refills | Status: DC

## 2015-12-21 NOTE — ED Triage Notes (Addendum)
Pt ambulatory to triage to with steady gait with c/o mid chest pain with a couple of episodes of nausea. Pt reports was seen at [facility] urgent care for same symptoms. Reports blood work, EKG and chest x ray performed, states results were negative. Pt states pain has increased, pt was dx with esophagitis, GERD, and indigestion at urgent care. Pt states when he drinks or eats his chest pain increases. Pt reports home temp of 101.9 tonight, pt states did not take anything for fever. Pt denies vomiting, diarrhea, shortness of breath, or abdominal pain. Pt alert and oriented x 4, no increased work in breathing noted, skin warm and dry.

## 2015-12-21 NOTE — ED Provider Notes (Addendum)
[facility]    CSN: [id number] Arrival date & time: 12/21/15  [id number]  First Provider Contact:  First MD Initiated Contact with Patient 12/21/15 [phone]        History   Chief Complaint Chief Complaint  Patient presents with  . Heartburn    HPI Stanley Myers is a 65 y.o. male.   Patient is here because of epigastric discomfort. He is a 65 year old white male who reports eating a baked potato on Tuesday evening and right after eating a baked potato had abdominal and epigastric discomfort. He states that when he swallows it hurts feels that he is just real bad heartburn he denies any chest pain any radiation but he does report he has some substernal discomfort. As stated this is been going on now for over 36 hours. He also reports history of diabetes hypertension no history of coronary artery disease. He's had phlebitis before he denies any shortness of breath at this time. Mother has some heart disease but no one in the family died of sudden death in his immediate family. He's had was in tooth extraction. He does also have a history of hyperlipidemia with hypertension, hyperlipidemia and diabetes . He takes a baby aspirin a day. He takes a statin for his cholesterol and his allergic to atorvastatin and to prednisone tapers.   The history is provided by the patient. No language interpreter was used.  Heartburn  This is a new problem. The current episode started 2 days ago. The problem occurs constantly. The problem has not changed since onset.Associated symptoms include abdominal pain. Pertinent negatives include no chest pain, no headaches and no shortness of breath. The symptoms are aggravated by swallowing. Nothing relieves the symptoms. He has tried nothing for the symptoms. The treatment provided no relief.    Past Medical History:  Diagnosis Date  . Diabetes mellitus without complication ([place])     There are no active problems to display for this patient.   Past  Surgical History:  Procedure Laterality Date  . WISDOM TOOTH EXTRACTION         Home Medications    Prior to Admission medications   Medication Sig Start Date End Date Taking? Authorizing Provider  aspirin EC 81 MG tablet Take 81 mg by mouth daily.   Yes Historical Provider, MD  celecoxib (CELEBREX) 200 MG capsule Take 200 mg by mouth 2 (two) times daily.   Yes Historical Provider, MD  Cetirizine HCl (ZYRTEC ALLERGY PO) Take 1 tablet by mouth daily.   Yes Historical Provider, MD  fenofibrate (TRICOR) 145 MG tablet Take 145 mg by mouth daily.   Yes Historical Provider, MD  glipiZIDE (GLUCOTROL XL) 5 MG 24 hr tablet Take 5 mg by mouth daily with breakfast.   Yes Historical Provider, MD  losartan (COZAAR) 100 MG tablet Take 100 mg by mouth daily.   Yes Historical Provider, MD  metFORMIN (GLUCOPHAGE) 1000 MG tablet Take 1,000 mg by mouth 2 (two) times daily with a meal.   Yes Historical Provider, MD  oxymetazoline (MUCINEX SINUS-MAX FULL FORCE) 0.05 % nasal spray Place 1 spray into both nostrils 2 (two) times daily.   Yes Historical Provider, MD  RaNITidine HCl (ZANTAC PO) Take 1 tablet by mouth daily.   Yes Historical Provider, MD  rosuvastatin (CRESTOR) 10 MG tablet Take 10 mg by mouth daily.   Yes Historical Provider, MD  sildenafil (REVATIO) 20 MG tablet Take 20 mg by mouth daily.   Yes Historical Provider, MD  traMADol-acetaminophen (ULTRACET) 37.5-325 MG tablet Take 2 tablets by mouth every 6 (six) hours as needed.   Yes Historical Provider, MD  triamcinolone (NASACORT ALLERGY 24HR) 55 MCG/ACT AERO nasal inhaler Place 2 sprays into the nose daily.   Yes Historical Provider, MD  sucralfate (CARAFATE) 1 g tablet Take 2 tablets (2 g total) by mouth 2 (two) times daily. An hour before eating. 12/21/15   Frederich Cha, MD    Family History Family History  Problem Relation Age of Onset  . Heart failure Mother   . Stroke Father     Social History Social History  Substance Use Topics  .  Smoking status: Never Smoker  . Smokeless tobacco: Never Used  . Alcohol use No     Allergies   Atorvastatin and Prednisone   Review of Systems Review of Systems  Constitutional: Negative for appetite change, diaphoresis, fatigue and fever.  Respiratory: Positive for cough. Negative for choking, chest tightness, shortness of breath and wheezing.   Cardiovascular: Negative for chest pain.  Gastrointestinal: Positive for abdominal pain and heartburn. Negative for abdominal distention.  Neurological: Negative for headaches.  All other systems reviewed and are negative.    Physical Exam Triage Vital Signs ED Triage Vitals  Enc Vitals Group     BP 12/21/15 0822 (!) 167/81     Pulse Rate 12/21/15 0822 84     Resp 12/21/15 0822 18     Temp 12/21/15 0822 99.1 F (37.3 C)     Temp Source 12/21/15 0822 Oral     SpO2 12/21/15 0822 98 %     Weight 12/21/15 0823 239 lb (108.4 kg)     Height 12/21/15 0823 5\' 10"  (1.778 m)     Head Circumference --      Peak Flow --      Pain Score 12/21/15 0838 3     Pain Loc --      Pain Edu? --      Excl. in [place]? --    No data found.   Updated Vital Signs BP (!) 175/86 (BP Location: Right Arm)   Pulse 80   Temp 99.1 F (37.3 C) (Oral)   Resp 16   Ht 5\' 10"  (1.778 m)   Wt 239 lb (108.4 kg)   SpO2 97%   BMI 34.29 kg/m   Visual Acuity Right Eye Distance:   Left Eye Distance:   Bilateral Distance:    Right Eye Near:   Left Eye Near:    Bilateral Near:     Physical Exam  Constitutional: He is oriented to person, place, and time. He appears well-developed and well-nourished. No distress.  HENT:  Head: Normocephalic and atraumatic.  Eyes: Pupils are equal, round, and reactive to light.  Neck: Normal range of motion. Neck supple.  Cardiovascular: Normal rate, regular rhythm and normal heart sounds.   Pulmonary/Chest: Effort normal and breath sounds normal. No respiratory distress.  Abdominal: Soft. Bowel sounds are normal. He  exhibits no distension. There is no tenderness.  Musculoskeletal: Normal range of motion. He exhibits no edema.  Neurological: He is alert and oriented to person, place, and time.  Skin: Skin is warm and dry. He is not diaphoretic.  Psychiatric: He has a normal mood and affect.  Vitals reviewed.    UC Treatments / Results  Labs (all labs ordered are listed, but only abnormal results are displayed) Labs Reviewed  AMYLASE - Abnormal; Notable for the following:       Result Value  Amylase 25 (*)    All other components within normal limits  COMPREHENSIVE METABOLIC PANEL - Abnormal; Notable for the following:    Glucose, Bld 177 (*)    ALT 15 (*)    Total Bilirubin 1.4 (*)    All other components within normal limits  CBC WITH DIFFERENTIAL/PLATELET - Abnormal; Notable for the following:    WBC 10.9 (*)    Neutro Abs 8.3 (*)    All other components within normal limits  LIPASE, BLOOD  TROPONIN I  CK  CKMB([facility] ONLY)    EKG  EKG Interpretation None     ED ECG REPORT I, Croy Drumwright H, the attending physician, personally viewed and interpreted this ECG.   Date: 12/21/2015  EKG Time:08:26:18  Rhythm: normal EKG, normal sinus rhythm, there are no previous tracings available for comparison  Axis: 81  Intervals:none  ST&T Change: none  Radiology Dg Chest 2 View  Result Date: 12/21/2015 CLINICAL DATA:  Chest pain EXAM: CHEST  2 VIEW COMPARISON:  02/16/2013 FINDINGS: The heart size and mediastinal contours are within normal limits. Both lungs are clear. Degenerative changes thoracic spine. IMPRESSION: No active cardiopulmonary disease. Electronically Signed   By: Lahoma Crocker M.D.   On: 12/21/2015 09:28    Procedures Procedures (including critical care time)  Medications Ordered in UC Medications  gi cocktail (Maalox,Lidocaine,Donnatal) (30 mLs Oral Given 12/21/15 0850)  gi cocktail (Maalox,Lidocaine,Donnatal) (30 mLs Oral Given 12/21/15 0920)     Initial Impression /  Assessment and Plan / UC Course  I have reviewed the triage vital signs and the nursing notes.  Pertinent labs & imaging results that were available during my care of the patient were reviewed by me and considered in my medical decision making (see chart for details). Results for orders placed or performed during the hospital encounter of 12/21/15  Amylase  Result Value Ref Range   Amylase 25 (L) 28 - 100 U/L  Lipase, blood  Result Value Ref Range   Lipase 29 11 - 51 U/L  Comprehensive metabolic panel  Result Value Ref Range   Sodium 138 135 - 145 mmol/L   Potassium 3.8 3.5 - 5.1 mmol/L   Chloride 105 101 - 111 mmol/L   CO2 24 22 - 32 mmol/L   Glucose, Bld 177 (H) 65 - 99 mg/dL   BUN 11 6 - 20 mg/dL   Creatinine, Ser 1.16 0.61 - 1.24 mg/dL   Calcium 9.0 8.9 - 10.3 mg/dL   Total Protein 6.8 6.5 - 8.1 g/dL   Albumin 4.5 3.5 - 5.0 g/dL   AST 15 15 - 41 U/L   ALT 15 (L) 17 - 63 U/L   Alkaline Phosphatase 43 38 - 126 U/L   Total Bilirubin 1.4 (H) 0.3 - 1.2 mg/dL   GFR calc non Af Amer >60 >60 mL/min   GFR calc Af Amer >60 >60 mL/min   Anion gap 9 5 - 15  CBC with Differential  Result Value Ref Range   WBC 10.9 (H) 3.8 - 10.6 K/uL   RBC 4.94 4.40 - 5.90 MIL/uL   Hemoglobin 14.5 13.0 - 18.0 g/dL   HCT 43.0 40.0 - 52.0 %   MCV 87.0 80.0 - 100.0 fL   MCH 29.3 26.0 - 34.0 pg   MCHC 33.7 32.0 - 36.0 g/dL   RDW 14.2 11.5 - 14.5 %   Platelets 174 150 - 440 K/uL   Neutrophils Relative % 76 %   Neutro Abs 8.3 (H)  1.4 - 6.5 K/uL   Lymphocytes Relative 14 %   Lymphs Abs 1.5 1.0 - 3.6 K/uL   Monocytes Relative 8 %   Monocytes Absolute 0.9 0.2 - 1.0 K/uL   Eosinophils Relative 1 %   Eosinophils Absolute 0.1 0 - 0.7 K/uL   Basophils Relative 1 %   Basophils Absolute 0.1 0 - 0.1 K/uL  Troponin I  Result Value Ref Range   Troponin I <0.03 <0.03 ng/mL  CK  Result Value Ref Range   Total CK 74 49 - 397 U/L  CKMB([facility] only)  Result Value Ref Range   CK, MB 1.6 0.5 - 5.0 ng/mL    Clinical Course       Final Clinical Impressions(s) / UC Diagnoses   Final diagnoses:  Heartburn symptom  Epigastric discomfort   Patient was given prescription for Carafate instructions follow-up with PCP for possible GI referral. Reports a GI cocktail helps with the esophagus but still is having some refills stomach discomfort./ Just be no EKG was normal New Prescriptions Discharge Medication List as of 12/21/2015  9:59 AM       Frederich Cha, MD 12/21/15 1041    Frederich Cha, MD 12/21/15 1044

## 2015-12-21 NOTE — ED Triage Notes (Signed)
Patient started having symptoms of epigastric pain / burning 2 nights ago. Patient does not have a history of heartburn.

## 2015-12-22 ENCOUNTER — Emergency Department
Admission: EM | Admit: 2015-12-22 | Discharge: 2015-12-22 | Disposition: A | Discharge status: Home / Self Care | Payer: Medicare Other | Attending: Emergency Medicine | Admitting: Emergency Medicine

## 2015-12-22 ENCOUNTER — Emergency Department: Payer: Medicare Other

## 2015-12-22 DIAGNOSIS — R52 Pain, unspecified: Secondary | ICD-10-CM

## 2015-12-22 DIAGNOSIS — R509 Fever, unspecified: Secondary | ICD-10-CM

## 2015-12-22 DIAGNOSIS — J40 Bronchitis, not specified as acute or chronic: Secondary | ICD-10-CM

## 2015-12-22 DIAGNOSIS — R091 Pleurisy: Secondary | ICD-10-CM

## 2015-12-22 HISTORY — DX: Pure hypercholesterolemia, unspecified: E78.00

## 2015-12-22 HISTORY — DX: Essential (primary) hypertension: I10

## 2015-12-22 LAB — URINALYSIS COMPLETE WITH MICROSCOPIC (ARMC ONLY)
BILIRUBIN URINE: NEGATIVE
Bacteria, UA: NONE SEEN
Glucose, UA: 50 mg/dL — AB
HGB URINE DIPSTICK: NEGATIVE
LEUKOCYTES UA: NEGATIVE
NITRITE: NEGATIVE
PH: 5 (ref 5.0–8.0)
Protein, ur: NEGATIVE mg/dL
Specific Gravity, Urine: 1.025 (ref 1.005–1.030)

## 2015-12-22 LAB — LIPASE, BLOOD: Lipase: 50 U/L (ref 11–51)

## 2015-12-22 LAB — TROPONIN I: Troponin I: <0.03 ng/mL (ref <0.03)

## 2015-12-22 MED ORDER — AMOXICILLIN-POT CLAVULANATE 875-125 MG PO TABS: 1.0000 | ORAL_TABLET | Freq: Two times a day (BID) | ORAL | 0 refills | Status: AC

## 2015-12-22 MED ORDER — SODIUM CHLORIDE 0.9 % IV BOLUS (SEPSIS)
1000.0000 mL | Freq: Once | INTRAVENOUS | Status: AC
  Administered 2015-12-22: 1000 mL via INTRAVENOUS

## 2015-12-22 MED ORDER — DIATRIZOATE MEGLUMINE & SODIUM 66-10 % PO SOLN
15.0000 mL | Freq: Once | ORAL | Status: AC
  Administered 2015-12-22: 15 mL via ORAL

## 2015-12-22 MED ORDER — AMOXICILLIN-POT CLAVULANATE 875-125 MG PO TABS
1.0000 | ORAL_TABLET | Freq: Once | ORAL | Status: AC
  Administered 2015-12-22: 1 via ORAL
  Filled 2015-12-22: qty 1

## 2015-12-22 MED ORDER — IOPAMIDOL (ISOVUE-300) INJECTION 61%
100.0000 mL | Freq: Once | INTRAVENOUS | Status: AC | PRN
  Administered 2015-12-22: 100 mL via INTRAVENOUS

## 2015-12-22 NOTE — ED Notes (Signed)
Discharge instructions reviewed with patient. Questions fielded by this RN. Patient verbalizes understanding of instructions. Patient discharged home in stable condition per Brown MD . No acute distress noted at time of discharge.   

## 2015-12-22 NOTE — ED Notes (Signed)
Patient transported to Ultrasound 

## 2015-12-22 NOTE — ED Notes (Signed)
Pt c/o mid chest pain with a couple of episodes of nausea. Pt states  was seen at [facility] urgent care for same symptoms earlier. Pt states  blood work, EKG and chest x ray performed everything negative. Pt states pain has increased since leaving urgent care, pt was dx with esophagitis, GERD. Pt states when he drinks or eats his chest pain increases to 10. Pt states pain to mid chest 3/10. pt still having nausea.  Pt wife  reports home temp of 101.9 tonight and she got concerned. Pt denies vomiting, diarrhea, shortness of breath, or abdominal pain. Will continue to monitor. No distress noted.

## 2015-12-22 NOTE — ED Provider Notes (Signed)
[facility] Emergency Department Provider Note  ____________________________________________   First MD Initiated Contact with Patient 12/22/15 0128     (approximate)  I have reviewed the triage vital signs and the nursing notes.   HISTORY  Chief Complaint Chest Pain    HPI Stanley Myers is a 65 y.o. male presents with epigastric/mid chest pain and fever. Patient states that he was seen and [place] urgent care for the same symptoms yesterday with negative blood work EKG and chest x-ray performed there. Patient states that pain is increased since he's been home despite being prescribed Carafate which she took without any relief. Patient states that the pain has been occurring since eating a baked potato on Tuesday. Patient does admit to cough and fever temperature at home 101.9.   Past Medical History:  Diagnosis Date  . Diabetes mellitus without complication ([place])   . Hypercholesteremia   . Hypertension     There are no active problems to display for this patient.   Past Surgical History:  Procedure Laterality Date  . WISDOM TOOTH EXTRACTION      Prior to Admission medications   Medication Sig Start Date End Date Taking? Authorizing Provider  aspirin EC 81 MG tablet Take 81 mg by mouth daily.    Historical Provider, MD  celecoxib (CELEBREX) 200 MG capsule Take 200 mg by mouth 2 (two) times daily.    Historical Provider, MD  Cetirizine HCl (ZYRTEC ALLERGY PO) Take 1 tablet by mouth daily.    Historical Provider, MD  fenofibrate (TRICOR) 145 MG tablet Take 145 mg by mouth daily.    Historical Provider, MD  glipiZIDE (GLUCOTROL XL) 5 MG 24 hr tablet Take 5 mg by mouth daily with breakfast.    Historical Provider, MD  losartan (COZAAR) 100 MG tablet Take 100 mg by mouth daily.    Historical Provider, MD  metFORMIN (GLUCOPHAGE) 1000 MG tablet Take 1,000 mg by mouth 2 (two) times daily with a meal.    Historical Provider, MD  oxymetazoline (MUCINEX  SINUS-MAX FULL FORCE) 0.05 % nasal spray Place 1 spray into both nostrils 2 (two) times daily.    Historical Provider, MD  RaNITidine HCl (ZANTAC PO) Take 1 tablet by mouth daily.    Historical Provider, MD  rosuvastatin (CRESTOR) 10 MG tablet Take 10 mg by mouth daily.    Historical Provider, MD  sildenafil (REVATIO) 20 MG tablet Take 20 mg by mouth daily.    Historical Provider, MD  sucralfate (CARAFATE) 1 g tablet Take 2 tablets (2 g total) by mouth 2 (two) times daily. An hour before eating. 12/21/15   Frederich Cha, MD  traMADol-acetaminophen (ULTRACET) 37.5-325 MG tablet Take 2 tablets by mouth every 6 (six) hours as needed.    Historical Provider, MD  triamcinolone (NASACORT ALLERGY 24HR) 55 MCG/ACT AERO nasal inhaler Place 2 sprays into the nose daily.    Historical Provider, MD    Allergies Atorvastatin and Prednisone  Family History  Problem Relation Age of Onset  . Heart failure Mother   . Stroke Father     Social History Social History  Substance Use Topics  . Smoking status: Never Smoker  . Smokeless tobacco: Never Used  . Alcohol use No    Review of Systems Constitutional: No fever/chills Eyes: No visual changes. ENT: No sore throat. Cardiovascular: Denies chest pain. Respiratory: Denies shortness of breath. Gastrointestinal: No abdominal pain.  No nausea, no vomiting.  No diarrhea.  No constipation. Genitourinary: Negative for  dysuria. Musculoskeletal: Negative for back pain. Skin: Negative for rash. Neurological: Negative for headaches, focal weakness or numbness.  10-point ROS otherwise negative.  ____________________________________________   PHYSICAL EXAM:  VITAL SIGNS: ED Triage Vitals  Enc Vitals Group     BP 12/21/15 2213 138/62     Pulse Rate 12/21/15 2213 86     Resp 12/21/15 2213 18     Temp 12/21/15 2213 100 F (37.8 C)     Temp Source 12/21/15 2213 Oral     SpO2 12/21/15 2213 95 %     Weight 12/21/15 2214 239 lb (108.4 kg)     Height  12/21/15 2214 5\' 10"  (1.778 m)     Head Circumference --      Peak Flow --      Pain Score 12/21/15 2214 3     Pain Loc --      Pain Edu? --      Excl. in [place]? --     Constitutional: Alert and oriented. Well appearing and in no acute distress. Eyes: Conjunctivae are normal. PERRL. EOMI. Head: Atraumatic. Ears:  Healthy appearing ear canals and TMs bilaterally Nose: No congestion/rhinnorhea. Mouth/Throat: Mucous membranes are moist.  Oropharynx non-erythematous. Neck: No stridor.  No meningeal signs.   Cardiovascular: Normal rate, regular rhythm. Good peripheral circulation. Grossly normal heart sounds. Respiratory: Normal respiratory effort.  No retractions. Lungs CTAB. Gastrointestinal: Soft and nontender. No distention.  Musculoskeletal: No lower extremity tenderness nor edema. No gross deformities of extremities. Neurologic:  Normal speech and language. No gross focal neurologic deficits are appreciated.  Skin:  Skin is warm, dry and intact. No rash noted. Psychiatric: Mood and affect are normal. Speech and behavior are normal.  ____________________________________________   LABS (all labs ordered are listed, but only abnormal results are displayed)  Labs Reviewed  BASIC METABOLIC PANEL - Abnormal; Notable for the following:       Result Value   Glucose, Bld 160 (*)    All other components within normal limits  CBC - Abnormal; Notable for the following:    WBC 12.9 (*)    All other components within normal limits  TROPONIN I   ____________________________________________  EKG  ED ECG REPORT I, [place] N Destaney Sarkis, the attending physician, personally viewed and interpreted this ECG.   Date: 12/22/2015  EKG Time: 10:09 PM  Rate: 91  Rhythm: Normal Sinus rhythm  Axis: normal  Intervals:Normal  ST&T Change: None  ____________________________________________  RADIOLOGY I, Jamul N Bertil Brickey, personally viewed and evaluated these images (plain radiographs) as part of  my medical decision making, as well as reviewing the written report by the radiologist.  Dg Chest 2 View  Result Date: 12/21/2015 CLINICAL DATA:  Chest pain EXAM: CHEST  2 VIEW COMPARISON:  02/16/2013 FINDINGS: The heart size and mediastinal contours are within normal limits. Both lungs are clear. Degenerative changes thoracic spine. IMPRESSION: No active cardiopulmonary disease. Electronically Signed   By: Lahoma Crocker M.D.   On: 12/21/2015 09:28    Procedures     INITIAL IMPRESSION / ASSESSMENT AND PLAN / ED COURSE  Pertinent labs & imaging results that were available during my care of the patient were reviewed by me and considered in my medical decision making (see chart for details).     Clinical Course    ____________________________________________  FINAL CLINICAL IMPRESSION(S) / ED DIAGNOSES  Final diagnoses:  Pain  Fever  Pleurisy  Bronchitis     MEDICATIONS GIVEN DURING THIS VISIT:  Medications -  No data to display   NEW OUTPATIENT MEDICATIONS STARTED DURING THIS VISIT:  New Prescriptions   No medications on file      Note:  This document was prepared using Dragon voice recognition software and may include unintentional dictation errors.    Gregor Hams, MD 12/22/15 [phone]

## 2016-05-08 ENCOUNTER — Ambulatory Visit
Admission: EM | Admit: 2016-05-08 | Discharge: 2016-05-08 | Disposition: A | Discharge status: Home / Self Care | Payer: Medicare Other | Attending: Family Medicine | Admitting: Family Medicine

## 2016-05-08 DIAGNOSIS — J101 Influenza due to other identified influenza virus with other respiratory manifestations: Secondary | ICD-10-CM | POA: Diagnosis not present

## 2016-05-08 LAB — RAPID INFLUENZA A&B ANTIGENS
Influenza A (ARMC): POSITIVE — AB
Influenza B (ARMC): NEGATIVE

## 2016-05-08 MED ORDER — OSELTAMIVIR PHOSPHATE 75 MG PO CAPS: 75.0000 mg | ORAL_CAPSULE | Freq: Two times a day (BID) | ORAL | 0 refills | Status: DC

## 2016-05-08 NOTE — ED Provider Notes (Signed)
CSN: [id number]     Arrival date & time 05/08/16  0932 History   First MD Initiated Contact with Patient 05/08/16 1153     Chief Complaint  Patient presents with  . Fever   (Consider location/radiation/quality/duration/timing/severity/associated sxs/prior Treatment) HPI: 66 y/o male presented with CC fever, fatigue, headache, body aches and non productive cough x 2 days.  Past Medical History:  Diagnosis Date  . Diabetes mellitus without complication ([place])   . Hypercholesteremia   . Hypertension    Past Surgical History:  Procedure Laterality Date  . WISDOM TOOTH EXTRACTION     Family History  Problem Relation Age of Onset  . Heart failure Mother   . Stroke Father    Social History  Substance Use Topics  . Smoking status: Never Smoker  . Smokeless tobacco: Never Used  . Alcohol use No    Review of Systems  Constitutional: Positive for fever.  HENT: Positive for congestion and postnasal drip. Negative for sinus pain and sinus pressure.   Respiratory: Positive for cough.     Allergies  Atorvastatin and Prednisone  Home Medications   Prior to Admission medications   Medication Sig Start Date End Date Taking? Authorizing Provider  aspirin EC 81 MG tablet Take 81 mg by mouth daily.   Yes Historical Provider, MD  celecoxib (CELEBREX) 200 MG capsule Take 200 mg by mouth 2 (two) times daily.   Yes Historical Provider, MD  cetirizine (ZYRTEC) 10 MG tablet Take 10 mg by mouth daily.   Yes Historical Provider, MD  fenofibrate (TRICOR) 145 MG tablet Take 145 mg by mouth daily.   Yes Historical Provider, MD  glipiZIDE (GLUCOTROL XL) 5 MG 24 hr tablet Take 5 mg by mouth daily with breakfast.   Yes Historical Provider, MD  losartan (COZAAR) 100 MG tablet Take 100 mg by mouth daily.   Yes Historical Provider, MD  metFORMIN (GLUCOPHAGE) 1000 MG tablet Take 1,000 mg by mouth 2 (two) times daily with a meal.   Yes Historical Provider, MD  oxymetazoline (MUCINEX SINUS-MAX FULL FORCE)  0.05 % nasal spray Place 1 spray into both nostrils 2 (two) times daily.   Yes Historical Provider, MD  ranitidine (ZANTAC) 150 MG tablet Take 150 mg by mouth daily.   Yes Historical Provider, MD  rosuvastatin (CRESTOR) 10 MG tablet Take 10 mg by mouth daily.   Yes Historical Provider, MD  sildenafil (REVATIO) 20 MG tablet Take 20 mg by mouth daily.   Yes Historical Provider, MD  traMADol-acetaminophen (ULTRACET) 37.5-325 MG tablet Take 2 tablets by mouth every 6 (six) hours as needed.   Yes Historical Provider, MD  triamcinolone (NASACORT ALLERGY 24HR) 55 MCG/ACT AERO nasal inhaler Place 2 sprays into the nose daily.   Yes Historical Provider, MD  sucralfate (CARAFATE) 1 g tablet Take 2 tablets (2 g total) by mouth 2 (two) times daily. An hour before eating. 12/21/15   Frederich Cha, MD   Meds Ordered and Administered this Visit  Medications - No data to display  BP (!) 154/72 (BP Location: Left Arm)   Pulse 78   Temp 98.8 F (37.1 C) (Oral)   Resp 17   Ht 5\' 10"  (1.778 m)   Wt 240 lb (108.9 kg)   SpO2 100%   BMI 34.44 kg/m  No data found.   Physical Exam  Constitutional: He appears well-developed and well-nourished.  Cardiovascular: Normal rate, regular rhythm and normal heart sounds.   Pulmonary/Chest: Effort normal. No respiratory distress. He has  wheezes.    Urgent Care Course   Clinical Course     Procedures (including critical care time)  Labs Review Labs Reviewed  RAPID INFLUENZA A&B ANTIGENS ([place]) - Abnormal; Notable for the following:       Result Value   Influenza A ([facility]) POSITIVE (*)    All other components within normal limits    Imaging Review No results found.   Visual Acuity Review  Right Eye Distance:   Left Eye Distance:   Bilateral Distance:    Right Eye Near:   Left Eye Near:    Bilateral Near:         MDM   1. Influenza A   Swab positive for Influenza A. Within time frame to initiate Tamiflu. Tylenol/Motrin for pain/fever.  Increase hydration and rest.    Trista Ciocca, NP 05/08/16 1158

## 2016-05-08 NOTE — Discharge Instructions (Signed)
Tylenol/Motrin for pain/fever. Increase hydration and rest.

## 2016-05-08 NOTE — ED Triage Notes (Signed)
Patient complains of cough and congestion with fever x 2 days. Patient states that he has also had some body aches. Patient reports that wife is a Therapist, sports and she heard some wheezing. Patient reports that he has a history of Pneumonia.

## 2019-08-22 ENCOUNTER — Ambulatory Visit
Admission: EM | Admit: 2019-08-22 | Discharge: 2019-08-22 | Disposition: A | Discharge status: Home / Self Care | Payer: Medicare PPO | Attending: Family Medicine | Admitting: Family Medicine

## 2019-08-22 ENCOUNTER — Other Ambulatory Visit: Payer: Self-pay

## 2019-08-22 DIAGNOSIS — L03115 Cellulitis of right lower limb: Secondary | ICD-10-CM | POA: Diagnosis not present

## 2019-08-22 MED ORDER — CEPHALEXIN 500 MG PO CAPS: 500.0000 mg | ORAL_CAPSULE | Freq: Three times a day (TID) | ORAL | 0 refills | Status: DC

## 2019-08-22 NOTE — ED Triage Notes (Signed)
Pt presents with c/o red area on the top of his right foot that started yesterday. Pt states area is painful and sore. Pt denies any known injury or insect bite. Pt does have full ROM to the ankle/foot.

## 2019-08-22 NOTE — ED Provider Notes (Signed)
[facility]    CSN: [id number] Arrival date & time: 08/22/19  1250      History   Chief Complaint Chief Complaint  Patient presents with  . Foot Pain    right    HPI Stanley Myers is a 69 y.o. male.   69 yo male with a c/o redness and tenderness to skin area on top of his right foot since yesterday. Denies any falls or other traumatic injury. States area is warm and tender to touch. Denies any fevers, chills or drainage.     Foot Pain    Past Medical History:  Diagnosis Date  . Diabetes mellitus without complication ([place])   . Hypercholesteremia   . Hypertension     There are no problems to display for this patient.   Past Surgical History:  Procedure Laterality Date  . WISDOM TOOTH EXTRACTION         Home Medications    Prior to Admission medications   Medication Sig Start Date End Date Taking? Authorizing Provider  aspirin EC 81 MG tablet Take 81 mg by mouth daily.   Yes [provider]  Azelastine-Fluticasone 137-50 MCG/ACT SUSP Place into the nose.   Yes [provider]  celecoxib (CELEBREX) 200 MG capsule Take 200 mg by mouth 2 (two) times daily.   Yes [provider]  fenofibrate (TRICOR) 145 MG tablet Take 145 mg by mouth daily.   Yes [provider]  glipiZIDE (GLUCOTROL XL) 5 MG 24 hr tablet Take 5 mg by mouth daily with breakfast.   Yes [provider]  losartan (COZAAR) 100 MG tablet Take 100 mg by mouth daily.   Yes [provider]  metFORMIN (GLUCOPHAGE) 1000 MG tablet Take 1,000 mg by mouth 2 (two) times daily with a meal.   Yes [provider]  oxymetazoline (MUCINEX SINUS-MAX FULL FORCE) 0.05 % nasal spray Place 1 spray into both nostrils 2 (two) times daily.   Yes [provider]  rosuvastatin (CRESTOR) 10 MG tablet Take 10 mg by mouth daily.   Yes [provider]  traMADol-acetaminophen (ULTRACET) 37.5-325 MG tablet Take 2 tablets by mouth every  6 (six) hours as needed.   Yes [provider]  cephALEXin (KEFLEX) 500 MG capsule Take 1 capsule (500 mg total) by mouth 3 (three) times daily. 08/22/19   Norval Gable, MD  cetirizine (ZYRTEC) 10 MG tablet Take 10 mg by mouth daily.    [provider]  oseltamivir (TAMIFLU) 75 MG capsule Take 1 capsule (75 mg total) by mouth every 12 (twelve) hours. 05/08/16   Multani, Bhupinder, NP  ranitidine (ZANTAC) 150 MG tablet Take 150 mg by mouth daily.    [provider]  sildenafil (REVATIO) 20 MG tablet Take 20 mg by mouth daily.    [provider]  sucralfate (CARAFATE) 1 g tablet Take 2 tablets (2 g total) by mouth 2 (two) times daily. An hour before eating. 12/21/15   Frederich Cha, MD  triamcinolone (NASACORT ALLERGY 24HR) 55 MCG/ACT AERO nasal inhaler Place 2 sprays into the nose daily.    [provider]    Family History Family History  Problem Relation Age of Onset  . Heart failure Mother   . Stroke Father     Social History Social History   Tobacco Use  . Smoking status: Never Smoker  . Smokeless tobacco: Never Used  Substance Use Topics  . Alcohol use: No  . Drug use: No  Allergies   Atorvastatin and Prednisone   Review of Systems Review of Systems   Physical Exam Triage Vital Signs ED Triage Vitals  Enc Vitals Group     BP 08/22/19 1303 131/60     Pulse Rate 08/22/19 1303 69     Resp 08/22/19 1303 18     Temp 08/22/19 1303 97.7 F (36.5 C)     Temp Source 08/22/19 1303 Oral     SpO2 08/22/19 1303 100 %     Weight 08/22/19 1259 240 lb (108.9 kg)     Height 08/22/19 1259 5\' 10"  (1.778 m)     Head Circumference --      Peak Flow --      Pain Score 08/22/19 1259 8     Pain Loc --      Pain Edu? --      Excl. in [place]? --    No data found.  Updated Vital Signs BP 131/60 (BP Location: Left Arm)   Pulse 69   Temp 97.7 F (36.5 C) (Oral)   Resp 18   Ht 5\' 10"  (1.778 m)   Wt 108.9 kg   SpO2 100%   BMI 34.44  kg/m   Visual Acuity Right Eye Distance:   Left Eye Distance:   Bilateral Distance:    Right Eye Near:   Left Eye Near:    Bilateral Near:     Physical Exam Vitals and nursing note reviewed.  Constitutional:      General: He is not in acute distress.    Appearance: He is not toxic-appearing or diaphoretic.  Musculoskeletal:     Right foot: Normal range of motion and normal capillary refill. No swelling, deformity, bunion, Charcot foot, foot drop, laceration or bony tenderness. Normal pulse.     Comments: Right foot neurovascularly intact; 3x4 cm skin area on dorsum of foot with mild blanchable erythema, warmth and mild tenderness to palpation  Neurological:     Mental Status: He is alert.      UC Treatments / Results  Labs (all labs ordered are listed, but only abnormal results are displayed) Labs Reviewed - No data to display  EKG   Radiology No results found.  Procedures Procedures (including critical care time)  Medications Ordered in UC Medications - No data to display  Initial Impression / Assessment and Plan / UC Course  I have reviewed the triage vital signs and the nursing notes.  Pertinent labs & imaging results that were available during my care of the patient were reviewed by me and considered in my medical decision making (see chart for details).     Final Clinical Impressions(s) / UC Diagnoses   Final diagnoses:  Cellulitis of foot, right     Discharge Instructions     Warm compresses to area and elevate leg    ED Prescriptions    Medication Sig Dispense Auth. Provider   cephALEXin (KEFLEX) 500 MG capsule Take 1 capsule (500 mg total) by mouth 3 (three) times daily. 30 capsule Norval Gable, MD      1. diagnosis reviewed with patient 2. rx as per orders above; reviewed possible side effects, interactions, risks and benefits  3. Recommend supportive treatment with warm compresses, otc analgesics prn 4. Follow-up prn if symptoms  worsen or don't improve   PDMP not reviewed this encounter.   Norval Gable, MD 08/22/19 1537

## 2019-08-22 NOTE — Discharge Instructions (Signed)
Warm compresses to area and elevate leg

## 2019-12-22 ENCOUNTER — Ambulatory Visit: Payer: Medicare PPO | Admitting: Dermatology

## 2019-12-22 ENCOUNTER — Other Ambulatory Visit: Payer: Self-pay

## 2019-12-22 DIAGNOSIS — L918 Other hypertrophic disorders of the skin: Secondary | ICD-10-CM

## 2019-12-22 DIAGNOSIS — L821 Other seborrheic keratosis: Secondary | ICD-10-CM

## 2019-12-22 DIAGNOSIS — D229 Melanocytic nevi, unspecified: Secondary | ICD-10-CM

## 2019-12-22 DIAGNOSIS — Z86018 Personal history of other benign neoplasm: Secondary | ICD-10-CM

## 2019-12-22 DIAGNOSIS — L578 Other skin changes due to chronic exposure to nonionizing radiation: Secondary | ICD-10-CM

## 2019-12-22 DIAGNOSIS — L820 Inflamed seborrheic keratosis: Secondary | ICD-10-CM

## 2019-12-22 DIAGNOSIS — Z1283 Encounter for screening for malignant neoplasm of skin: Secondary | ICD-10-CM | POA: Diagnosis not present

## 2019-12-22 DIAGNOSIS — L814 Other melanin hyperpigmentation: Secondary | ICD-10-CM

## 2019-12-22 DIAGNOSIS — Q825 Congenital non-neoplastic nevus: Secondary | ICD-10-CM | POA: Diagnosis not present

## 2019-12-22 DIAGNOSIS — I8393 Asymptomatic varicose veins of bilateral lower extremities: Secondary | ICD-10-CM

## 2019-12-22 DIAGNOSIS — D1800 Hemangioma unspecified site: Secondary | ICD-10-CM

## 2019-12-22 NOTE — Patient Instructions (Signed)

## 2019-12-22 NOTE — Progress Notes (Signed)
Follow-Up Visit   Subjective  Stanley Myers is a 69 y.o. male who presents for the following: Annual Exam (total body skin exam, hx of dysplastic nevus L infrapectoral) and check spot (L preauricular, >1 yr, may have gotten larger). The patient presents for Total-Body Skin Exam (TBSE) for skin cancer screening and mole check.  The following portions of the chart were reviewed this encounter and updated as appropriate:  Tobacco  Allergies  Meds  Problems  Med Hx  Surg Hx  Fam Hx     Review of Systems:  No other skin or systemic complaints except as noted in HPI or Assessment and Plan.  Objective  Well appearing patient in no apparent distress; mood and affect are within normal limits.  A full examination was performed including scalp, head, eyes, ears, nose, lips, neck, chest, axillae, abdomen, back, buttocks, bilateral upper extremities, bilateral lower extremities, hands, feet, fingers, toes, fingernails, and toenails. All findings within normal limits unless otherwise noted below.  Objective  Left infrapectoral: Scar with no evidence of recurrence.   Objective  face x 4 (4): Erythematous keratotic or waxy stuck-on papule or plaque.   Objective  L sup buttocks: Brown patch  Objective  bil legs: Varices bil legs   Assessment & Plan  History of dysplastic nevus Left infrapectoral  Clear, observe for changes   Inflamed seborrheic keratosis (4) face x 4  Destruction of lesion - face x 4 Complexity: simple   Destruction method: cryotherapy   Informed consent: discussed and consent obtained   Timeout:  patient name, date of birth, surgical site, and procedure verified Lesion destroyed using liquid nitrogen: Yes   Region frozen until ice ball extended beyond lesion: Yes   Outcome: patient tolerated procedure well with no complications   Post-procedure details: wound care instructions given    Congenital non-neoplastic nevus L sup  buttocks Benign-appearing.  Observation.  Call clinic for new or changing moles.  Recommend daily use of broad spectrum spf 30+ sunscreen to sun-exposed areas.    Varicose veins of both lower extremities, unspecified whether complicated bil legs Benign, no symptoms per pt, observe.   Discussed graduated compression stockings.  Also discussed treatment options with evaluation by vascular surgeon and/or sclerotherapy. Sclerotherapy - bil legs  Lentigines - Scattered tan macules - Discussed due to sun exposure - Benign, observe - Call for any changes  Seborrheic Keratoses - Stuck-on, waxy, tan-brown papules and plaques  - Discussed benign etiology and prognosis. - Observe - Call for any changes  Melanocytic Nevi - Tan-brown and/or pink-flesh-colored symmetric macules and papules - Benign appearing on exam today - Observation - Call clinic for new or changing moles - Recommend daily use of broad spectrum spf 30+ sunscreen to sun-exposed areas.   Hemangiomas - Red papules - Discussed benign nature - Observe - Call for any changes  Actinic Damage - diffuse scaly erythematous macules with underlying dyspigmentation - Recommend daily broad spectrum sunscreen SPF 30+ to sun-exposed areas, reapply every 2 hours as needed.  - Call for new or changing lesions.  Skin cancer screening performed today.  Acrochordons (Skin Tags) - Fleshy, skin-colored pedunculated papules - Benign appearing.  - Observe. - If desired, they can be removed with an in office procedure that is not covered by insurance. - Please call the clinic if you notice any new or changing lesions.  Return in about 1 year (around 12/21/2020) for TBSE, hx of Dysplastic Nevus.   I, Sonya Hupman, RMA, am acting  as scribe for Sarina Ser, MD .  Documentation: I have reviewed the above documentation for accuracy and completeness, and I agree with the above.  Sarina Ser, MD

## 2019-12-29 ENCOUNTER — Encounter: Payer: Self-pay | Admitting: Dermatology

## 2020-02-01 ENCOUNTER — Ambulatory Visit: Payer: Medicare PPO | Attending: Internal Medicine

## 2020-02-01 ENCOUNTER — Other Ambulatory Visit: Payer: Self-pay | Admitting: Internal Medicine

## 2020-02-01 DIAGNOSIS — Z23 Encounter for immunization: Secondary | ICD-10-CM

## 2020-02-01 NOTE — Progress Notes (Signed)
° °  Covid-19 Vaccination Clinic  Name:  Stanley Myers    MRN: [id number] DOB: [birth year]  02/01/2020  [name] was observed post Covid-19 immunization for 15 minutes without incident. He was provided with Vaccine Information Sheet and instruction to access the V-Safe system.   Mr. Bills was instructed to call 911 with any severe reactions post vaccine:  Difficulty breathing   Swelling of face and throat   A fast heartbeat   A bad rash all over body   Dizziness and weakness

## 2020-06-26 ENCOUNTER — Other Ambulatory Visit: Payer: Self-pay

## 2020-06-26 ENCOUNTER — Ambulatory Visit
Admission: RE | Admit: 2020-06-26 | Discharge: 2020-06-26 | Disposition: A | Discharge status: Home / Self Care | Payer: Medicare PPO | Source: Ambulatory Visit | Attending: Sports Medicine | Admitting: Sports Medicine

## 2020-06-26 VITALS — BP 119/90 | HR 62 | Temp 98.0°F | Resp 18 | Ht 70.0 in | Wt 240.1 lb

## 2020-06-26 DIAGNOSIS — M76829 Posterior tibial tendinitis, unspecified leg: Secondary | ICD-10-CM

## 2020-06-26 DIAGNOSIS — M2141 Flat foot [pes planus] (acquired), right foot: Secondary | ICD-10-CM

## 2020-06-26 DIAGNOSIS — R29898 Other symptoms and signs involving the musculoskeletal system: Secondary | ICD-10-CM | POA: Diagnosis not present

## 2020-06-26 DIAGNOSIS — M25572 Pain in left ankle and joints of left foot: Secondary | ICD-10-CM | POA: Diagnosis not present

## 2020-06-26 DIAGNOSIS — M2142 Flat foot [pes planus] (acquired), left foot: Secondary | ICD-10-CM

## 2020-06-26 NOTE — ED Provider Notes (Signed)
[facility]    CSN: [id number] Arrival date & time: 06/26/20  1242      History   Chief Complaint Chief Complaint  Patient presents with  . Ankle Pain  . Joint Swelling    HPI Stanley Myers is a 70 y.o. male.   Patient is a pleasant 70 year old male who presents for evaluation of the above issues.  He reports left medial ankle pain with some swelling and redness for about 48 hours.  He is concerned because he has a history of gout and cellulitis in the past.  No fever shakes chills.  No accidents trauma or falls.  No nausea vomiting or diarrhea.  No systemic symptoms.  He has been taking Celebrex twice daily for some chronic musculoskeletal issues.  He says the symptoms have not gotten any worse but they are not improving.  He denies any significant back pain or radicular symptoms.  No numbness or tingling.  No red flag signs or symptoms elicited on history.     Past Medical History:  Diagnosis Date  . Diabetes mellitus without complication ([place])   . Dysplastic nevus 12/11/2015   Left infrapectoral. Mild atypia, lateral margin involved.  . Hypercholesteremia   . Hypertension     There are no problems to display for this patient.   Past Surgical History:  Procedure Laterality Date  . WISDOM TOOTH EXTRACTION         Home Medications    Prior to Admission medications   Medication Sig Start Date End Date Taking? Authorizing Provider  aspirin EC 81 MG tablet Take 81 mg by mouth daily.   Yes [provider]  Azelastine-Fluticasone 137-50 MCG/ACT SUSP Place into the nose.   Yes [provider]  celecoxib (CELEBREX) 200 MG capsule Take 200 mg by mouth 2 (two) times daily.   Yes [provider]  fenofibrate (TRICOR) 145 MG tablet Take 145 mg by mouth daily.   Yes [provider]  glipiZIDE (GLUCOTROL XL) 5 MG 24 hr tablet Take 5 mg by mouth daily with breakfast.   Yes [provider]  losartan (COZAAR) 100 MG  tablet Take 100 mg by mouth daily.   Yes [provider]  metFORMIN (GLUCOPHAGE) 1000 MG tablet Take 1,000 mg by mouth 2 (two) times daily with a meal.   Yes [provider]  oxymetazoline (AFRIN) 0.05 % nasal spray Place 1 spray into both nostrils 2 (two) times daily.   Yes [provider]  ranitidine (ZANTAC) 150 MG tablet Take 150 mg by mouth daily.   Yes [provider]  rosuvastatin (CRESTOR) 10 MG tablet Take 10 mg by mouth daily.   Yes [provider]  sildenafil (REVATIO) 20 MG tablet Take 20 mg by mouth daily.   Yes [provider]  sucralfate (CARAFATE) 1 g tablet Take 2 tablets (2 g total) by mouth 2 (two) times daily. An hour before eating. 12/21/15  Yes Frederich Cha, MD  traMADol-acetaminophen (ULTRACET) 37.5-325 MG tablet Take 2 tablets by mouth every 6 (six) hours as needed.   Yes [provider]  triamcinolone (NASACORT) 55 MCG/ACT AERO nasal inhaler Place 2 sprays into the nose daily.   Yes [provider]  valsartan-hydrochlorothiazide (DIOVAN-HCT) 160-12.5 MG tablet  09/18/19  Yes [provider]  cephALEXin (KEFLEX) 500 MG capsule Take 1 capsule (500 mg total) by mouth 3 (three) times daily. 08/22/19   Norval Gable, MD  cetirizine (ZYRTEC) 10 MG tablet Take 10 mg  by mouth daily.    [provider]  oseltamivir (TAMIFLU) 75 MG capsule Take 1 capsule (75 mg total) by mouth every 12 (twelve) hours. 05/08/16   Multani, Bhupinder, NP    Family History Family History  Problem Relation Age of Onset  . Heart failure Mother   . Stroke Father     Social History Social History   Tobacco Use  . Smoking status: Never Smoker  . Smokeless tobacco: Never Used  Vaping Use  . Vaping Use: Never used  Substance Use Topics  . Alcohol use: No  . Drug use: No     Allergies   Atorvastatin and Prednisone   Review of Systems Review of Systems  Constitutional: Positive for activity change.  Negative for appetite change, chills, diaphoresis and fever.  HENT: Negative.   Eyes: Negative.   Respiratory: Negative.   Cardiovascular: Negative.   Gastrointestinal: Negative.   Genitourinary: Negative.   Musculoskeletal: Positive for arthralgias, gait problem and joint swelling. Negative for back pain, myalgias, neck pain and neck stiffness.  Skin: Positive for color change. Negative for pallor, rash and wound.  Neurological: Negative for dizziness, syncope, light-headedness, numbness and headaches.  All other systems reviewed and are negative.    Physical Exam Triage Vital Signs ED Triage Vitals  Enc Vitals Group     BP 06/26/20 1308 119/90     Pulse Rate 06/26/20 1308 62     Resp 06/26/20 1308 18     Temp 06/26/20 1308 98 F (36.7 C)     Temp Source 06/26/20 1308 Oral     SpO2 06/26/20 1308 99 %     Weight 06/26/20 1305 240 lb 1.3 oz (108.9 kg)     Height 06/26/20 1305 5\' 10"  (1.778 m)     Head Circumference --      Peak Flow --      Pain Score 06/26/20 1305 3     Pain Loc --      Pain Edu? --      Excl. in [place]? --    No data found.  Updated Vital Signs BP 119/90 (BP Location: Left Arm)   Pulse 62   Temp 98 F (36.7 C) (Oral)   Resp 18   Ht 5\' 10"  (1.778 m)   Wt 108.9 kg   SpO2 99%   BMI 34.45 kg/m   Visual Acuity Right Eye Distance:   Left Eye Distance:   Bilateral Distance:    Right Eye Near:   Left Eye Near:    Bilateral Near:     Physical Exam Vitals and nursing note reviewed.  Constitutional:      General: He is not in acute distress.    Appearance: Normal appearance. He is not ill-appearing, toxic-appearing or diaphoretic.  HENT:     Head: Normocephalic and atraumatic.  Eyes:     Extraocular Movements: Extraocular movements intact.     Pupils: Pupils are equal, round, and reactive to light.  Cardiovascular:     Rate and Rhythm: Normal rate and regular rhythm.     Pulses: Normal pulses.     Heart sounds: Normal heart sounds. No murmur  heard. No friction rub. No gallop.   Pulmonary:     Effort: Pulmonary effort is normal. No respiratory distress.     Breath sounds: Normal breath sounds. No wheezing, rhonchi or rales.  Chest:     Chest wall: No tenderness.  Musculoskeletal:        General: Tenderness present. No  deformity or signs of injury.     Comments: Right lower extremity: Patient does have pes planus.  But otherwise normal to inspection palpation range of motion special test.  No strength deficits noted.  Left lower extremity: No bony abnormality ecchymosis or erythema.  Some mild soft tissue swelling noted medially behind the medial malleolus.  He has tenderness to palpation along the tib post tendon about 4 cm proximal to the medial malleolus and tenderness over that tendon traversing posterior to the medial malleolus down to its insertion on the navicular.  He has some significant weakness with resisted strength testing of the tib post tendon but there is no tendon retraction.  He has adequate range of motion.  There is pes planus noted.  No midfoot instability.  Achilles tendon is intact without nodularity.  Special tests are all within normal limits.  Neurovascular normal sensation 2+ pulses distally.  Skin:    General: Skin is warm and dry.     Capillary Refill: Capillary refill takes less than 2 seconds.     Findings: No bruising, erythema, lesion or rash.  Neurological:     General: No focal deficit present.     Mental Status: He is alert and oriented to person, place, and time.      UC Treatments / Results  Labs (all labs ordered are listed, but only abnormal results are displayed) Labs Reviewed - No data to display  EKG   Radiology No results found.  Procedures Procedures (including critical care time)  Medications Ordered in UC Medications - No data to display  Initial Impression / Assessment and Plan / UC Course  I have reviewed the triage vital signs and the nursing notes.  Pertinent  labs & imaging results that were available during my care of the patient were reviewed by me and considered in my medical decision making (see chart for details).  Clinical impression: Left medial ankle pain with some swelling.  No evidence of cellulitis or gout.  Is consistent with tibialis posterior tendinitis versus dysfunction.  Patient does have pes planus present bilaterally.  Treatment plan: 1.  The findings and treatment plan were discussed in detail with the patient.  Patient was in agreement. 2.  I reassured him that there was no evidence of any cellulitis or gout. 3.  Educational handout was provided. 4.  Continue on the Celebrex as prescribed as well as adding in any Tylenol he needs for any fever or discomfort. 5.  He certainly would benefit from over-the-counter orthotics.  He can get inserts at the drugstore.  He would benefit from custom orthotics.  I will refer him to podiatry and/or orthopedics for him to be evaluated for orthotics.  He will also need some physical therapy to address that to post dysfunction and weakness.  I will have Ortho or podiatry refer him as they feel is clinically warranted. 6.  Educational handout provided. 7.  I reassured him that there was no evidence of any gout or cellulitis.  Certainly if things change that he should seek out care either here, with his primary care physician, with orthopedics, with podiatry, or the emergency room. 8.  Follow-up here as needed.    Final Clinical Impressions(s) / UC Diagnoses   Final diagnoses:  Acute left ankle pain  Tibialis posterior dysfunction  Ankle weakness  Pes planus of both feet     Discharge Instructions     Your exam is consistent with posterior tibial tendinitis or posterior tibial dysfunction.  You had some significant weakness on examination.  There is no evidence of gout or cellulitis or active infection on your exam. Please see educational handouts. You do need to be on an anti-inflammatory  but you are already on Celebrex so no additional medications. You also have flatfeet and would benefit from inserts for your shoes.  Please seek out that at a drugstore.  If you need formal custom orthotics then your primary care physician can give you a referral for them. You would definitely benefit from formal physical therapy.  Your primary care physician, orthopedics, or podiatry can assess you for a referral for that.  I provided the names and numbers of orthopedics and podiatry above.  You can call at your leisure.  I hope you get to feeling better, Dr. Drema Dallas    ED Prescriptions    None     PDMP not reviewed this encounter.   Verda Cumins, MD 06/28/20 [phone]

## 2020-06-26 NOTE — ED Triage Notes (Signed)
Patient c/o right ankle pain, redness and swelling that started 2 days ago. Denies injury. Patient states he has had hx of cellulitis and gout.

## 2020-06-26 NOTE — Discharge Instructions (Addendum)
Your exam is consistent with posterior tibial tendinitis or posterior tibial dysfunction.  You had some significant weakness on examination.  There is no evidence of gout or cellulitis or active infection on your exam. Please see educational handouts. You do need to be on an anti-inflammatory but you are already on Celebrex so no additional medications. You also have flatfeet and would benefit from inserts for your shoes.  Please seek out that at a drugstore.  If you need formal custom orthotics then your primary care physician can give you a referral for them. You would definitely benefit from formal physical therapy.  Your primary care physician, orthopedics, or podiatry can assess you for a referral for that.  I provided the names and numbers of orthopedics and podiatry above.  You can call at your leisure.  I hope you get to feeling better, Dr. Drema Dallas

## 2020-09-18 ENCOUNTER — Other Ambulatory Visit: Payer: Self-pay

## 2020-09-18 MED ORDER — NIRMATRELVIR/RITONAVIR (PAXLOVID) TABLET (RENAL DOSING)
ORAL_TABLET | ORAL | 0 refills | Status: DC
  Filled 2020-09-18: qty 20, 5d supply, fill #0

## 2020-12-27 ENCOUNTER — Ambulatory Visit: Payer: Medicare PPO | Admitting: Dermatology

## 2020-12-27 ENCOUNTER — Other Ambulatory Visit: Payer: Self-pay

## 2020-12-27 DIAGNOSIS — Q825 Congenital non-neoplastic nevus: Secondary | ICD-10-CM | POA: Diagnosis not present

## 2020-12-27 DIAGNOSIS — Z1283 Encounter for screening for malignant neoplasm of skin: Secondary | ICD-10-CM | POA: Diagnosis not present

## 2020-12-27 DIAGNOSIS — L821 Other seborrheic keratosis: Secondary | ICD-10-CM

## 2020-12-27 DIAGNOSIS — L578 Other skin changes due to chronic exposure to nonionizing radiation: Secondary | ICD-10-CM

## 2020-12-27 DIAGNOSIS — L814 Other melanin hyperpigmentation: Secondary | ICD-10-CM

## 2020-12-27 DIAGNOSIS — L820 Inflamed seborrheic keratosis: Secondary | ICD-10-CM | POA: Diagnosis not present

## 2020-12-27 DIAGNOSIS — Z86018 Personal history of other benign neoplasm: Secondary | ICD-10-CM | POA: Diagnosis not present

## 2020-12-27 DIAGNOSIS — D229 Melanocytic nevi, unspecified: Secondary | ICD-10-CM

## 2020-12-27 DIAGNOSIS — D1800 Hemangioma unspecified site: Secondary | ICD-10-CM

## 2020-12-27 NOTE — Patient Instructions (Addendum)

## 2020-12-27 NOTE — Progress Notes (Signed)
Follow-Up Visit   Subjective  Stanley Myers is a 70 y.o. male who presents for the following: Total body skin exam (Hx of Dysplastic Nevus L infrapectoral). The patient presents for Total-Body Skin Exam (TBSE) for skin cancer screening and mole check.  The following portions of the chart were reviewed this encounter and updated as appropriate:   Tobacco  Allergies  Meds  Problems  Med Hx  Surg Hx  Fam Hx     Review of Systems:  No other skin or systemic complaints except as noted in HPI or Assessment and Plan.  Objective  Well appearing patient in no apparent distress; mood and affect are within normal limits.  A full examination was performed including scalp, head, eyes, ears, nose, lips, neck, chest, axillae, abdomen, back, buttocks, bilateral upper extremities, bilateral lower extremities, hands, feet, fingers, toes, fingernails, and toenails. All findings within normal limits unless otherwise noted below.  L infrapectoral Scar with no evidence of recurrence.   L forehead x 1, Total = 1 Erythematous keratotic or waxy stuck-on papule or plaque.   L buttock Brown macule with hypertrichosis   Assessment & Plan   Lentigines - Scattered tan macules - Due to sun exposure - Benign-appering, observe - Recommend daily broad spectrum sunscreen SPF 30+ to sun-exposed areas, reapply every 2 hours as needed. - Call for any changes  Seborrheic Keratoses - Stuck-on, waxy, tan-brown papules and/or plaques  - Benign-appearing - Discussed benign etiology and prognosis. - Observe - Call for any changes  Melanocytic Nevi - Tan-brown and/or pink-flesh-colored symmetric macules and papules - Benign appearing on exam today - Observation - Call clinic for new or changing moles - Recommend daily use of broad spectrum spf 30+ sunscreen to sun-exposed areas.   Hemangiomas - Red papules - Discussed benign nature - Observe - Call for any changes  Actinic Damage - Chronic  condition, secondary to cumulative UV/sun exposure - diffuse scaly erythematous macules with underlying dyspigmentation - Recommend daily broad spectrum sunscreen SPF 30+ to sun-exposed areas, reapply every 2 hours as needed.  - Staying in the shade or wearing long sleeves, sun glasses (UVA+UVB protection) and wide brim hats (4-inch brim around the entire circumference of the hat) are also recommended for sun protection.  - Call for new or changing lesions.  Skin cancer screening performed today.  History of dysplastic nevus L infrapectoral Clear. Observe for recurrence. Call clinic for new or changing lesions.  Recommend regular skin exams, daily broad-spectrum spf 30+ sunscreen use, and photoprotection.    Inflamed seborrheic keratosis L forehead x 1, Total = 1 Destruction of lesion - L forehead x 1, Total = 1 Complexity: simple   Destruction method: cryotherapy   Informed consent: discussed and consent obtained   Timeout:  patient name, date of birth, surgical site, and procedure verified Lesion destroyed using liquid nitrogen: Yes   Region frozen until ice ball extended beyond lesion: Yes   Outcome: patient tolerated procedure well with no complications   Post-procedure details: wound care instructions given    Congenital non-neoplastic nevus L buttock Benign-appearing.  Observation.  Call clinic for new or changing moles.  Recommend daily use of broad spectrum spf 30+ sunscreen to sun-exposed areas.    Skin cancer screening  Return in about 1 year (around 12/27/2021) for TBSE, Hx of Dysplastic nevi.  I, Othelia Pulling, RMA, am acting as scribe for Sarina Ser, MD . Documentation: I have reviewed the above documentation for accuracy and completeness, and I  agree with the above.  Sarina Ser, MD

## 2020-12-28 ENCOUNTER — Encounter: Payer: Self-pay | Admitting: Dermatology

## 2021-08-03 ENCOUNTER — Ambulatory Visit
Admission: RE | Admit: 2021-08-03 | Discharge: 2021-08-03 | Disposition: A | Discharge status: Home / Self Care | Payer: Medicare PPO | Source: Ambulatory Visit | Attending: Emergency Medicine | Admitting: Emergency Medicine

## 2021-08-03 ENCOUNTER — Other Ambulatory Visit: Payer: Self-pay

## 2021-08-03 VITALS — BP 154/75 | HR 69 | Temp 98.9°F | Resp 15 | Ht 69.0 in | Wt 226.0 lb

## 2021-08-03 DIAGNOSIS — Z20822 Contact with and (suspected) exposure to covid-19: Secondary | ICD-10-CM | POA: Insufficient documentation

## 2021-08-03 DIAGNOSIS — J069 Acute upper respiratory infection, unspecified: Secondary | ICD-10-CM | POA: Insufficient documentation

## 2021-08-03 DIAGNOSIS — H1033 Unspecified acute conjunctivitis, bilateral: Secondary | ICD-10-CM | POA: Insufficient documentation

## 2021-08-03 LAB — RESP PANEL BY RT-PCR (FLU A&B, COVID) ARPGX2
Influenza A by PCR: NEGATIVE
Influenza B by PCR: NEGATIVE
SARS Coronavirus 2 by RT PCR: NEGATIVE

## 2021-08-03 MED ORDER — BENZONATATE 200 MG PO CAPS: 200.0000 mg | ORAL_CAPSULE | Freq: Three times a day (TID) | ORAL | 0 refills | Status: DC | PRN

## 2021-08-03 MED ORDER — GENTAMICIN SULFATE 0.3 % OP SOLN: 1.0000 [drp] | OPHTHALMIC | 0 refills | Status: DC

## 2021-08-03 NOTE — ED Triage Notes (Signed)
Patient states that he is currently being treated for eye infection in his right eye.  Patient also states that he is having the same symptoms in his left eye now.  Patient also reports that he has had cough and nasal congestion.  Patient denies fevers.  Patient did home covid test yesterday and was negative.  ?

## 2021-08-03 NOTE — ED Provider Notes (Signed)
HPI ? ?SUBJECTIVE: ? ?Stanley Myers is a 71 y.o. male who presents with 2 issues: First, he reports right eye, scratchy feeling, purulent discharge starting 3 days ago.  He had an E-visit and was prescribed gentamicin eyedrops with significant improvement in his symptoms 2 days ago.  He states that he started having identical symptoms in his left eye this morning with purulent discharge.  No visual changes, fevers, nausea, vomiting, headache, photophobia.  He reports a scratchy feeling with extraocular movements.  No eye pain, or other pain with extraocular movements.  no foreign body sensation.  He does not wear glasses or contacts.  No aggravating or alleviating factors.  He has not tried anything for the symptoms. ? ?Second, he reports a nonproductive cough, sore throat, intermittent hoarseness/raspy voice starting 2 days ago.  He reports bilateral ear pain with a sore throat last night, but states that this has resolved.  States that he is unable to sleep at night due to the cough.  He had a negative home COVID test last night.  No fevers, sinus pain or pressure, facial swelling, upper dental pain, change in his baseline postnasal drip, wheezing, shortness of breath, GERD symptoms.  No known COVID or flu exposure.  He got 3 doses of the COVID-vaccine and this years flu vaccine.  He takes Mucinex nightly on an ongoing basis.  No aggravating or alleviating factors.  His symptoms are not changed with going outside or with exposure to known allergens.  He has a past medical history of diabetes, hypertension, hypercholesterolemia, COVID in May 21.  No history of glaucoma, allergies, GERD, pulmonary disease.  PCP: [facility] [place] ? ? ? ?Past Medical History:  ?Diagnosis Date  ? Diabetes mellitus without complication ([place])   ? Dysplastic nevus 12/11/2015  ? Left infrapectoral. Mild atypia, lateral margin involved.  ? Hypercholesteremia   ? Hypertension   ? ? ?Past Surgical History:  ?Procedure  Laterality Date  ? WISDOM TOOTH EXTRACTION    ? ? ?Family History  ?Problem Relation Age of Onset  ? Heart failure Mother   ? Stroke Father   ? ? ?Social History  ? ?Tobacco Use  ? Smoking status: Never  ? Smokeless tobacco: Never  ?Vaping Use  ? Vaping Use: Never used  ?Substance Use Topics  ? Alcohol use: No  ? Drug use: No  ? ? ?No current facility-administered medications for this encounter. ? ?Current Outpatient Medications:  ?  aspirin EC 81 MG tablet, Take 81 mg by mouth daily., Disp: , Rfl:  ?  Azelastine-Fluticasone 137-50 MCG/ACT SUSP, Place into the nose., Disp: , Rfl:  ?  benzonatate (TESSALON) 200 MG capsule, Take 1 capsule (200 mg total) by mouth 3 (three) times daily as needed for cough., Disp: 30 capsule, Rfl: 0 ?  celecoxib (CELEBREX) 200 MG capsule, Take 200 mg by mouth 2 (two) times daily., Disp: , Rfl:  ?  Dulaglutide (TRULICITY) 3.33 OV/2.9VB SOPN, Trulicity 1.66 MA/0.0 mL subcutaneous pen injector, Disp: , Rfl:  ?  fenofibrate (TRICOR) 145 MG tablet, Take 145 mg by mouth daily., Disp: , Rfl:  ?  gabapentin (NEURONTIN) 100 MG capsule, TAKE 2 CAPSULES(200 MG) BY MOUTH AT BEDTIME, Disp: , Rfl:  ?  gentamicin (GARAMYCIN) 0.3 % ophthalmic solution, Place 1 drop into the left eye every 4 (four) hours., Disp: 5 mL, Rfl: 0 ?  glipiZIDE (GLUCOTROL XL) 5 MG 24 hr tablet, Take 5 mg by mouth daily with breakfast., Disp: , Rfl:  ?  metFORMIN (GLUCOPHAGE) 1000 MG tablet, Take 1,000 mg by mouth 2 (two) times daily with a meal., Disp: , Rfl:  ?  rosuvastatin (CRESTOR) 10 MG tablet, Take 10 mg by mouth daily., Disp: , Rfl:  ?  valsartan-hydrochlorothiazide (DIOVAN-HCT) 160-12.5 MG tablet, , Disp: , Rfl:  ?  cetirizine (ZYRTEC) 10 MG tablet, Take 10 mg by mouth daily., Disp: , Rfl:  ?  cyclobenzaprine (FLEXERIL) 10 MG tablet, TAKE 1/2 TO 1 TABLET(5 TO 10 MG) BY MOUTH THREE TIMES DAILY AS NEEDED FOR MUSCLE SPASMS, Disp: , Rfl:  ?  gentamicin (GARAMYCIN) 0.3 % ophthalmic solution, SMARTSIG:In Eye(s), Disp: , Rfl:   ?  losartan (COZAAR) 100 MG tablet, Take 100 mg by mouth daily., Disp: , Rfl:  ?  oxymetazoline (AFRIN) 0.05 % nasal spray, Place 1 spray into both nostrils 2 (two) times daily., Disp: , Rfl:  ?  ranitidine (ZANTAC) 150 MG tablet, Take 150 mg by mouth daily., Disp: , Rfl:  ?  sildenafil (REVATIO) 20 MG tablet, Take 20 mg by mouth daily., Disp: , Rfl:  ?  sucralfate (CARAFATE) 1 g tablet, Take 2 tablets (2 g total) by mouth 2 (two) times daily. An hour before eating., Disp: 120 tablet, Rfl: 0 ?  traMADol-acetaminophen (ULTRACET) 37.5-325 MG tablet, Take 2 tablets by mouth every 6 (six) hours as needed., Disp: , Rfl:  ?  triamcinolone (NASACORT) 55 MCG/ACT AERO nasal inhaler, Place 2 sprays into the nose daily., Disp: , Rfl:  ? ?Allergies  ?Allergen Reactions  ? Atorvastatin Other (See Comments)  ?  Severe cramps  ? Prednisone Other (See Comments)  ?  Taper pack can make symptoms worse. Patient not clear as to type of reaction.  ? ? ? ?ROS ? ?As noted in HPI.  ? ?Physical Exam ? ?BP (!) 154/75 (BP Location: Left Arm)   Pulse 69   Temp 98.9 ?F (37.2 ?C) (Oral)   Resp 15   Ht '5\' 9"'$  (1.753 m)   Wt 102.5 kg   SpO2 100%   BMI 33.37 kg/m?  ? ?Constitutional: Well developed, well nourished, no acute distress ?Eyes:  EOMI, PERRLA, no direct or consensual photophobia.  Bilateral conjunctival injection.  Purulent drainage from left eye.  No foreign body seen on lid eversion.  No pain with EOMs.  No periorbital erythema, edema, tenderness.  No hyphema.  No corneal abrasion seen on flourescin exam bilaterally. ? ? ? ? ?   ?HENT: Normocephalic, atraumatic,mucus membranes moist.  Positive nasal congestion.  Normal turbinates.  No maxillary, frontal sinus tenderness.  TMs normal bilaterally.  Extensive postnasal drip. ?Respiratory: Normal inspiratory effort, lungs clear bilaterally ?Cardiovascular: Normal rate, regular rhythm, no murmurs rubs or gallop ?GI: nondistended ?skin: No rash, skin intact ?Musculoskeletal: no  deformities ?Neurologic: Alert & oriented x 3, no focal neuro deficits ?Psychiatric: Speech and behavior appropriate ? ? ?ED Course ? ? ?Medications - No data to display ? ?Orders Placed This Encounter  ?Procedures  ? Resp Panel by RT-PCR (Flu A&B, Covid) Nasopharyngeal Swab  ?  Standing Status:   Standing  ?  Number of Occurrences:   1  ? Airborne and Contact precautions  ?  Standing Status:   Standing  ?  Number of Occurrences:   1  ? ? ?Results for orders placed or performed during the hospital encounter of 08/03/21 (from the past 24 hour(s))  ?Resp Panel by RT-PCR (Flu A&B, Covid) Nasopharyngeal Swab     Status: None  ? Collection Time: 08/03/21 12:59  PM  ? Specimen: Nasopharyngeal Swab; Nasopharyngeal(NP) swabs in vial transport medium  ?Result Value Ref Range  ? SARS Coronavirus 2 by RT PCR NEGATIVE NEGATIVE  ? Influenza A by PCR NEGATIVE NEGATIVE  ? Influenza B by PCR NEGATIVE NEGATIVE  ? ?No results found. ? ?ED Clinical Impression ? ?1. Acute conjunctivitis of both eyes, unspecified acute conjunctivitis type   ?2. Acute upper respiratory infection   ?3. Encounter for laboratory testing for COVID-19 virus   ?  ? ?ED Assessment/Plan ? ?No previous or outside records available documenting eye infection/treatment ? ?1.  Conjunctivitis.  Suspect viral versus bacterial with the purulent discharge.  However, he is responding to the gentamicin eyedrops in his right eye, so will prescribe gentamicin eyedrops for his left eye.  Strict hand hygiene, cool compresses, Systane.  Follow-up with his ophthalmologist if not better in a week. ? ?2.  URI.  Will check COVID, flu as patient will be a candidate for antivirals based on his history of diabetes, hypertension and hypercholesterolemia.  In the meantime, Tessalon, saline nasal irrigation, continue Astelin and Nasacort, continue Mucinex.  If this does not help in the next few days, he will switch to Zyrtec. ? ?COVID, influenza PCR negative.  Plan as above. ? ?Discussed  labs,  MDM, treatment plan, and plan for follow-up with patient. patient agrees with plan.  ? ?Meds ordered this encounter  ?Medications  ? gentamicin (GARAMYCIN) 0.3 % ophthalmic solution  ?  Sig: Place 1 drop into the l

## 2021-08-03 NOTE — Discharge Instructions (Addendum)
gentamicin eyedrops for your left eye.  Strict hand hygiene, cool compresses, Systane.   Follow-up with your ophthalmologist if not better in a week. ? ?We will contact you if and only if your COVID and flu testing come back positive.  In the meantime, Tessalon, saline nasal irrigation with a Milta Deiters Med rinse and distilled water as often as you want, continue Astelin and Nasacort, continue Mucinex.  If  the Mucinex does not help in the next few days, switch to Zyrtec.  If you need a prescription for Astelin and Nasacort, or for Flonase, let me know and I will write for some more. ? ?

## 2021-08-05 ENCOUNTER — Other Ambulatory Visit: Payer: Self-pay

## 2021-08-05 ENCOUNTER — Ambulatory Visit (INDEPENDENT_AMBULATORY_CARE_PROVIDER_SITE_OTHER): Payer: Medicare PPO

## 2021-08-05 ENCOUNTER — Ambulatory Visit
Admission: EM | Admit: 2021-08-05 | Discharge: 2021-08-05 | Disposition: A | Discharge status: Home / Self Care | Payer: Medicare PPO | Attending: Emergency Medicine | Admitting: Emergency Medicine

## 2021-08-05 DIAGNOSIS — R059 Cough, unspecified: Secondary | ICD-10-CM | POA: Diagnosis not present

## 2021-08-05 DIAGNOSIS — R051 Acute cough: Secondary | ICD-10-CM | POA: Insufficient documentation

## 2021-08-05 DIAGNOSIS — R509 Fever, unspecified: Secondary | ICD-10-CM | POA: Diagnosis not present

## 2021-08-05 DIAGNOSIS — J029 Acute pharyngitis, unspecified: Secondary | ICD-10-CM | POA: Insufficient documentation

## 2021-08-05 DIAGNOSIS — J019 Acute sinusitis, unspecified: Secondary | ICD-10-CM | POA: Diagnosis present

## 2021-08-05 LAB — GROUP A STREP BY PCR: Group A Strep by PCR: NOT DETECTED

## 2021-08-05 MED ORDER — ALUM & MAG HYDROXIDE-SIMETH 200-200-20 MG/5ML PO SUSP
30.0000 mL | Freq: Once | ORAL | Status: AC
Start: 2021-08-05 — End: 2021-08-05
  Administered 2021-08-05: 30 mL via ORAL

## 2021-08-05 MED ORDER — LIDOCAINE VISCOUS HCL 2 % MT SOLN
15.0000 mL | Freq: Once | OROMUCOSAL | Status: AC
  Administered 2021-08-05: 15 mL via ORAL

## 2021-08-05 MED ORDER — AMOXICILLIN-POT CLAVULANATE 875-125 MG PO TABS: 1.0000 | ORAL_TABLET | Freq: Two times a day (BID) | ORAL | 0 refills | Status: DC

## 2021-08-05 NOTE — Discharge Instructions (Addendum)
Your strep PCR is negative, and your chest x-ray is negative for pneumonia.  I am concerned that the postnasal drip is causing the cough and the sore throat.  Try some saline nasal irrigation with a Milta Deiters Med rinse and distilled water as often as you want in addition to your 2 nasal steroid sprays.  Try Mucinex rather than Claritin or Zyrtec.  I am prescribing Augmentin for sinus infection since you are describing sinus pain and pressure.  You may take 5 mL of Benadryl mixed with 5 mL of Maalox together 3-4 times a day as needed for sore throat.  Hold it in your mouth and then swallow.  Continue Tylenol and ibuprofen as needed. ?

## 2021-08-05 NOTE — ED Provider Notes (Signed)
HPI ? ?SUBJECTIVE: ? ?Stanley Myers is a 71 y.o. male who presents with 4 days of worsening cough, sore throat, primarily on the left.  He states that the pain radiates into his ear.  He reports fevers Tmax 100.6 despite Tylenol.  He reports a little nasal congestion, rhinorrhea, sinus pain and pressure, but lots of postnasal drip.  No wheezing, shortness of breath, dyspnea on exertion, drooling, trismus, neck stiffness, voice changes, sensation of throat swelling shut.  No abdominal pain, rash.  No GERD symptoms, burning chest pain.  He has been taking Tylenol with improvement in his symptoms.  He has also tried Tessalon, viscous lidocaine, Vicks sore throat lozenges, drinking fluids and using 2 steroid nasal sprays.  Symptoms are worse at night.  He has a past medical history of diabetes, hypertension, occasional GERD.  No history of pulmonary disease.  PCP: [facility] [place]. ? ?I saw the patient on 4/7 for conjunctivitis, sore throat. COVID, flu PCR test was negative, and I sent him home on gentamicin eyedrops.  He states that his eyes are improving. ? ? ?Past Medical History:  ?Diagnosis Date  ? Diabetes mellitus without complication ([place])   ? Dysplastic nevus 12/11/2015  ? Left infrapectoral. Mild atypia, lateral margin involved.  ? Hypercholesteremia   ? Hypertension   ? ? ?Past Surgical History:  ?Procedure Laterality Date  ? WISDOM TOOTH EXTRACTION    ? ? ?Family History  ?Problem Relation Age of Onset  ? Heart failure Mother   ? Stroke Father   ? ? ?Social History  ? ?Tobacco Use  ? Smoking status: Never  ? Smokeless tobacco: Never  ?Vaping Use  ? Vaping Use: Never used  ?Substance Use Topics  ? Alcohol use: No  ? Drug use: No  ? ? ?No current facility-administered medications for this encounter. ? ?Current Outpatient Medications:  ?  amoxicillin-clavulanate (AUGMENTIN) 875-125 MG tablet, Take 1 tablet by mouth 2 (two) times daily. X 7 days, Disp: 14 tablet, Rfl: 0 ?  aspirin EC 81 MG  tablet, Take 81 mg by mouth daily., Disp: , Rfl:  ?  Azelastine-Fluticasone 137-50 MCG/ACT SUSP, Place into the nose., Disp: , Rfl:  ?  benzonatate (TESSALON) 200 MG capsule, Take 1 capsule (200 mg total) by mouth 3 (three) times daily as needed for cough., Disp: 30 capsule, Rfl: 0 ?  celecoxib (CELEBREX) 200 MG capsule, Take 200 mg by mouth 2 (two) times daily., Disp: , Rfl:  ?  cyclobenzaprine (FLEXERIL) 10 MG tablet, TAKE 1/2 TO 1 TABLET(5 TO 10 MG) BY MOUTH THREE TIMES DAILY AS NEEDED FOR MUSCLE SPASMS, Disp: , Rfl:  ?  Dulaglutide (TRULICITY) 3.76 EG/3.1DV SOPN, Trulicity 7.61 YW/7.3 mL subcutaneous pen injector, Disp: , Rfl:  ?  fenofibrate (TRICOR) 145 MG tablet, Take 145 mg by mouth daily., Disp: , Rfl:  ?  gabapentin (NEURONTIN) 100 MG capsule, TAKE 2 CAPSULES(200 MG) BY MOUTH AT BEDTIME, Disp: , Rfl:  ?  gentamicin (GARAMYCIN) 0.3 % ophthalmic solution, SMARTSIG:In Eye(s), Disp: , Rfl:  ?  gentamicin (GARAMYCIN) 0.3 % ophthalmic solution, Place 1 drop into the left eye every 4 (four) hours., Disp: 5 mL, Rfl: 0 ?  glipiZIDE (GLUCOTROL XL) 5 MG 24 hr tablet, Take 5 mg by mouth daily with breakfast., Disp: , Rfl:  ?  losartan (COZAAR) 100 MG tablet, Take 100 mg by mouth daily., Disp: , Rfl:  ?  metFORMIN (GLUCOPHAGE) 1000 MG tablet, Take 1,000 mg by mouth 2 (two) times  daily with a meal., Disp: , Rfl:  ?  ranitidine (ZANTAC) 150 MG tablet, Take 150 mg by mouth daily., Disp: , Rfl:  ?  rosuvastatin (CRESTOR) 10 MG tablet, Take 10 mg by mouth daily., Disp: , Rfl:  ?  sildenafil (REVATIO) 20 MG tablet, Take 20 mg by mouth daily., Disp: , Rfl:  ?  traMADol-acetaminophen (ULTRACET) 37.5-325 MG tablet, Take 2 tablets by mouth every 6 (six) hours as needed., Disp: , Rfl:  ?  triamcinolone (NASACORT) 55 MCG/ACT AERO nasal inhaler, Place 2 sprays into the nose daily., Disp: , Rfl:  ?  valsartan-hydrochlorothiazide (DIOVAN-HCT) 160-12.5 MG tablet, , Disp: , Rfl:  ? ?Allergies  ?Allergen Reactions  ? Atorvastatin Other  (See Comments)  ?  Severe cramps  ? Prednisone Other (See Comments)  ?  Taper pack can make symptoms worse. Patient not clear as to type of reaction.  ? ? ? ?ROS ? ?As noted in HPI.  ? ?Physical Exam ? ?BP 129/72 (BP Location: Left Arm)   Pulse 70   Temp 97.7 ?F (36.5 ?C) (Oral)   Resp 17   SpO2 99%  ? ?Constitutional: Well developed, well nourished, no acute distress ?Eyes:  EOMI, conjunctiva normal bilaterally ?HENT: Normocephalic, atraumatic,mucus membranes moist.  Left TM normal.  Mild nasal congestion.  Normal turbinates.  No maxillary, frontal sinus tenderness.  Erythematous oropharynx, tonsils normal size without exudates.  Uvula midline.  Extensive postnasal drip. ?Neck: No appreciable cervical lymphadenopathy ?Respiratory: Normal inspiratory effort, rhonchi left lower lobe.  No anterior, lateral chest wall tenderness ?Cardiovascular: Normal rate regular rhythm, no murmurs ?GI: nondistended ?skin: No rash, skin intact ?Musculoskeletal: no deformities ?Neurologic: Alert & oriented x 3, no focal neuro deficits ?Psychiatric: Speech and behavior appropriate ? ? ?ED Course ? ? ?Medications  ?alum & mag hydroxide-simeth (MAALOX/MYLANTA) 200-200-20 MG/5ML suspension 30 mL (30 mLs Oral Given 08/05/21 1040)  ?  And  ?lidocaine (XYLOCAINE) 2 % viscous mouth solution 15 mL (15 mLs Oral Given 08/05/21 1040)  ? ? ?Orders Placed This Encounter  ?Procedures  ? Group A Strep by PCR  ?  Standing Status:   Standing  ?  Number of Occurrences:   1  ? DG Chest 2 View  ?  Standing Status:   Standing  ?  Number of Occurrences:   1  ?  Order Specific Question:   Reason for Exam (SYMPTOM  OR DIAGNOSIS REQUIRED)  ?  Answer:   cough fever ronchi LLL r/o PNA  ? ? ?Results for orders placed or performed during the hospital encounter of 08/05/21 (from the past 24 hour(s))  ?Group A Strep by PCR     Status: None  ? Collection Time: 08/05/21  9:51 AM  ? Specimen: Throat; Sterile Swab  ?Result Value Ref Range  ? Group A Strep by PCR NOT  DETECTED NOT DETECTED  ? ?DG Chest 2 View ? ?Result Date: 08/05/2021 ?CLINICAL DATA:  Fever and cough for 3 days. EXAM: CHEST - 2 VIEW COMPARISON:  12/21/2015. FINDINGS: Cardiac silhouette is normal in size and configuration. No mediastinal or hilar masses. No evidence of adenopathy. Clear lungs.  No pleural effusion or pneumothorax. Skeletal structures are intact. IMPRESSION: No active cardiopulmonary disease. Electronically Signed   By: Lajean Manes M.D.   On: 08/05/2021 10:54   ? ?ED Clinical Impression ? ?1. Pharyngitis, unspecified etiology   ?2. Acute non-recurrent sinusitis, unspecified location   ?3. Acute cough   ?  ? ?ED Assessment/Plan ? ?Previous records  reviewed.  As noted in HPI. ? ?Strep PCR negative.  Checking chest x-ray because of the rhonchi in the left lower lobe to rule out pneumonia. ? ?Chest x-ray independently reviewed.  No pneumonia.  See radiology report for details. ? ?Strep negative.  There is no evidence of peritonsillar abscess at this time.  Chest x-ray negative for pneumonia.  Concern for a sinusitis with a new fevers even though he does not have any sinus tenderness on exam today.  Home with Augmentin, saline nasal irrigation, continue his two steroid nasal sprays.  Benadryl/Maalox mixture for the sore throat. ? ?Discussed labs, imaging, MDM, treatment plan, and plan for follow-up with patient. Discussed sn/sx that should prompt return to the ED. patient agrees with plan.  ? ?Meds ordered this encounter  ?Medications  ? AND Linked Order Group  ?  alum & mag hydroxide-simeth (MAALOX/MYLANTA) 997-741-42 MG/5ML suspension 30 mL  ?  lidocaine (XYLOCAINE) 2 % viscous mouth solution 15 mL  ? amoxicillin-clavulanate (AUGMENTIN) 875-125 MG tablet  ?  Sig: Take 1 tablet by mouth 2 (two) times daily. X 7 days  ?  Dispense:  14 tablet  ?  Refill:  0  ? ? ? ? ?*This clinic note was created using [employer]. Therefore, there may be occasional mistakes despite careful  proofreading. ? ?? ? ?  ?Melynda Ripple, MD ?08/05/21 1212 ? ?

## 2021-08-05 NOTE — ED Triage Notes (Signed)
Last seen on 4/7 for conjunctivitis and sore throat. Today, Pt reports that his eye sxs have improved, but sore throat and cough has worsened with a new onset of fever. Notes pain w/swallowing. Has been taking tylenol and tessalon. ?Tmax 100.6. ?

## 2021-12-05 ENCOUNTER — Other Ambulatory Visit: Payer: Self-pay | Admitting: Physician Assistant

## 2021-12-05 DIAGNOSIS — M5416 Radiculopathy, lumbar region: Secondary | ICD-10-CM

## 2021-12-11 ENCOUNTER — Ambulatory Visit
Admission: RE | Admit: 2021-12-11 | Discharge: 2021-12-11 | Disposition: A | Discharge status: Home / Self Care | Payer: Medicare PPO | Source: Ambulatory Visit | Attending: Physician Assistant | Admitting: Physician Assistant

## 2021-12-11 DIAGNOSIS — M5416 Radiculopathy, lumbar region: Secondary | ICD-10-CM | POA: Insufficient documentation

## 2021-12-27 ENCOUNTER — Ambulatory Visit: Payer: Medicare PPO | Admitting: Dermatology

## 2021-12-27 DIAGNOSIS — D239 Other benign neoplasm of skin, unspecified: Secondary | ICD-10-CM | POA: Diagnosis not present

## 2021-12-27 DIAGNOSIS — Q825 Congenital non-neoplastic nevus: Secondary | ICD-10-CM

## 2021-12-27 DIAGNOSIS — Z1283 Encounter for screening for malignant neoplasm of skin: Secondary | ICD-10-CM | POA: Diagnosis not present

## 2021-12-27 DIAGNOSIS — L578 Other skin changes due to chronic exposure to nonionizing radiation: Secondary | ICD-10-CM | POA: Diagnosis not present

## 2021-12-27 DIAGNOSIS — D1800 Hemangioma unspecified site: Secondary | ICD-10-CM

## 2021-12-27 DIAGNOSIS — L814 Other melanin hyperpigmentation: Secondary | ICD-10-CM

## 2021-12-27 DIAGNOSIS — L821 Other seborrheic keratosis: Secondary | ICD-10-CM

## 2021-12-27 DIAGNOSIS — Z86018 Personal history of other benign neoplasm: Secondary | ICD-10-CM

## 2021-12-27 DIAGNOSIS — D229 Melanocytic nevi, unspecified: Secondary | ICD-10-CM

## 2021-12-27 NOTE — Patient Instructions (Signed)
Due to recent changes in healthcare laws, you may see results of your pathology and/or laboratory studies on MyChart before the doctors have had a chance to review them. We understand that in some cases there may be results that are confusing or concerning to you. Please understand that not all results are received at the same time and often the doctors may need to interpret multiple results in order to provide you with the best plan of care or course of treatment. Therefore, we ask that you please give us 2 business days to thoroughly review all your results before contacting the office for clarification. Should we see a critical lab result, you will be contacted sooner.   If You Need Anything After Your Visit  If you have any questions or concerns for your doctor, please call our main line at [phone] and press option 4 to reach your doctor's medical assistant. If no one answers, please leave a voicemail as directed and we will return your call as soon as possible. Messages left after 4 pm will be answered the following business day.   You may also send us a message via MyChart. We typically respond to MyChart messages within 1-2 business days.  For prescription refills, please ask your pharmacy to contact our office. Our fax number is [phone].  If you have an urgent issue when the clinic is closed that cannot wait until the next business day, you can page your doctor at the number below.    Please note that while we do our best to be available for urgent issues outside of office hours, we are not available 24/7.   If you have an urgent issue and are unable to reach us, you may choose to seek medical care at your doctor's office, retail clinic, urgent care center, or emergency room.  If you have a medical emergency, please immediately call 911 or go to the emergency department.  Pager Numbers  - Dr. Kowalski: [phone]  - Dr. Moye: [phone]  - Dr. Stewart:  [phone]  In the event of inclement weather, please call our main line at [phone] for an update on the status of any delays or closures.  Dermatology Medication Tips: Please keep the boxes that topical medications come in in order to help keep track of the instructions about where and how to use these. Pharmacies typically print the medication instructions only on the boxes and not directly on the medication tubes.   If your medication is too expensive, please contact our office at [phone] option 4 or send us a message through MyChart.   We are unable to tell what your co-pay for medications will be in advance as this is different depending on your insurance coverage. However, we may be able to find a substitute medication at lower cost or fill out paperwork to get insurance to cover a needed medication.   If a prior authorization is required to get your medication covered by your insurance company, please allow us 1-2 business days to complete this process.  Drug prices often vary depending on where the prescription is filled and some pharmacies may offer cheaper prices.  The website www.goodrx.com contains coupons for medications through different pharmacies. The prices here do not account for what the cost may be with help from insurance (it may be cheaper with your insurance), but the website can give you the price if you did not use any insurance.  - You can print the associated coupon and take it with   your prescription to the pharmacy.  - You may also stop by our office during regular business hours and pick up a GoodRx coupon card.  - If you need your prescription sent electronically to a different pharmacy, notify our office through [facility] MyChart or by phone at [phone] option 4.     Si Usted Necesita Algo Despus de Su Visita  Tambin puede enviarnos un mensaje a travs de MyChart. Por lo general respondemos a los mensajes de MyChart en el transcurso de 1 a 2  das hbiles.  Para renovar recetas, por favor pida a su farmacia que se ponga en contacto con nuestra oficina. Nuestro nmero de fax es el [phone].  Si tiene un asunto urgente cuando la clnica est cerrada y que no puede esperar hasta el siguiente da hbil, puede llamar/localizar a su doctor(a) al nmero que aparece a continuacin.   Por favor, tenga en cuenta que aunque hacemos todo lo posible para estar disponibles para asuntos urgentes fuera del horario de oficina, no estamos disponibles las 24 horas del da, los 7 das de la semana.   Si tiene un problema urgente y no puede comunicarse con nosotros, puede optar por buscar atencin mdica  en el consultorio de su doctor(a), en una clnica privada, en un centro de atencin urgente o en una sala de emergencias.  Si tiene una emergencia mdica, por favor llame inmediatamente al 911 o vaya a la sala de emergencias.  Nmeros de bper  - Dr. Kowalski: [phone]  - Dra. Moye: [phone]  - Dra. Stewart: [phone]  En caso de inclemencias del tiempo, por favor llame a nuestra lnea principal al [phone] para una actualizacin sobre el estado de cualquier retraso o cierre.  Consejos para la medicacin en dermatologa: Por favor, guarde las cajas en las que vienen los medicamentos de uso tpico para ayudarle a seguir las instrucciones sobre dnde y cmo usarlos. Las farmacias generalmente imprimen las instrucciones del medicamento slo en las cajas y no directamente en los tubos del medicamento.   Si su medicamento es muy caro, por favor, pngase en contacto con nuestra oficina llamando al [phone] y presione la opcin 4 o envenos un mensaje a travs de MyChart.   No podemos decirle cul ser su copago por los medicamentos por adelantado ya que esto es diferente dependiendo de la cobertura de su seguro. Sin embargo, es posible que podamos encontrar un medicamento sustituto a menor costo o llenar un formulario para que el  seguro cubra el medicamento que se considera necesario.   Si se requiere una autorizacin previa para que su compaa de seguros cubra su medicamento, por favor permtanos de 1 a 2 das hbiles para completar este proceso.  Los precios de los medicamentos varan con frecuencia dependiendo del lugar de dnde se surte la receta y alguna farmacias pueden ofrecer precios ms baratos.  El sitio web www.goodrx.com tiene cupones para medicamentos de diferentes farmacias. Los precios aqu no tienen en cuenta lo que podra costar con la ayuda del seguro (puede ser ms barato con su seguro), pero el sitio web puede darle el precio si no utiliz ningn seguro.  - Puede imprimir el cupn correspondiente y llevarlo con su receta a la farmacia.  - Tambin puede pasar por nuestra oficina durante el horario de atencin regular y recoger una tarjeta de cupones de GoodRx.  - Si necesita que su receta se enve electrnicamente a una farmacia diferente, informe a nuestra oficina a travs de MyChart de [place] [facility]   o por telfono llamando al [phone] y presione la opcin 4.  

## 2021-12-27 NOTE — Progress Notes (Signed)
   Follow-Up Visit   Subjective  Dhanush Jokerst is a 71 y.o. male who presents for the following: Annual Exam. Hx of Dysplastic nevus.  The patient presents for Total-Body Skin Exam (TBSE) for skin cancer screening and mole check.  The patient has spots, moles and lesions to be evaluated, some may be new or changing and the patient has concerns that these could be cancer.   The following portions of the chart were reviewed this encounter and updated as appropriate:   Tobacco  Allergies  Meds  Problems  Med Hx  Surg Hx  Fam Hx     Review of Systems:  No other skin or systemic complaints except as noted in HPI or Assessment and Plan.  Objective  Well appearing patient in no apparent distress; mood and affect are within normal limits.  A full examination was performed including scalp, head, eyes, ears, nose, lips, neck, chest, axillae, abdomen, back, buttocks, bilateral upper extremities, bilateral lower extremities, hands, feet, fingers, toes, fingernails, and toenails. All findings within normal limits unless otherwise noted below.  left buttock Brown macule   Assessment & Plan  Congenital nevus left buttock  Benign-appearing.  Observation.  Call clinic for new or changing lesions.  Recommend daily use of broad spectrum spf 30+ sunscreen to sun-exposed areas.    Lentigines - Scattered tan macules - Due to sun exposure - Benign-appearing, observe - Recommend daily broad spectrum sunscreen SPF 30+ to sun-exposed areas, reapply every 2 hours as needed. - Call for any changes  Seborrheic Keratoses - Stuck-on, waxy, tan-brown papules and/or plaques  - Benign-appearing - Discussed benign etiology and prognosis. - Observe - Call for any changes  Melanocytic Nevi - Tan-brown and/or pink-flesh-colored symmetric macules and papules - Benign appearing on exam today - Observation - Call clinic for new or changing moles - Recommend daily use of broad spectrum spf 30+  sunscreen to sun-exposed areas.   Hemangiomas - Red papules - Discussed benign nature - Observe - Call for any changes  Actinic Damage - Chronic condition, secondary to cumulative UV/sun exposure - diffuse scaly erythematous macules with underlying dyspigmentation - Recommend daily broad spectrum sunscreen SPF 30+ to sun-exposed areas, reapply every 2 hours as needed.  - Staying in the shade or wearing long sleeves, sun glasses (UVA+UVB protection) and wide brim hats (4-inch brim around the entire circumference of the hat) are also recommended for sun protection.  - Call for new or changing lesions.  History of Dysplastic Nevi Left infra pectoral 2017 - No evidence of recurrence today - Recommend regular full body skin exams - Recommend daily broad spectrum sunscreen SPF 30+ to sun-exposed areas, reapply every 2 hours as needed.  - Call if any new or changing lesions are noted between office visits   Skin cancer screening performed today.   Return in about 1 year (around 12/28/2022) for TBSE, hx of Dysplastic nevus .  IMarye Round, CMA, am acting as scribe for Sarina Ser, MD .  Documentation: I have reviewed the above documentation for accuracy and completeness, and I agree with the above.  Sarina Ser, MD

## 2022-01-04 ENCOUNTER — Encounter: Payer: Self-pay | Admitting: Dermatology

## 2023-01-30 ENCOUNTER — Ambulatory Visit: Payer: Medicare PPO | Admitting: Dermatology

## 2023-01-30 VITALS — BP 139/74 | HR 61

## 2023-01-30 DIAGNOSIS — L814 Other melanin hyperpigmentation: Secondary | ICD-10-CM | POA: Diagnosis not present

## 2023-01-30 DIAGNOSIS — L813 Cafe au lait spots: Secondary | ICD-10-CM

## 2023-01-30 DIAGNOSIS — D1801 Hemangioma of skin and subcutaneous tissue: Secondary | ICD-10-CM | POA: Diagnosis not present

## 2023-01-30 DIAGNOSIS — Z86018 Personal history of other benign neoplasm: Secondary | ICD-10-CM

## 2023-01-30 DIAGNOSIS — L578 Other skin changes due to chronic exposure to nonionizing radiation: Secondary | ICD-10-CM | POA: Diagnosis not present

## 2023-01-30 DIAGNOSIS — L821 Other seborrheic keratosis: Secondary | ICD-10-CM

## 2023-01-30 DIAGNOSIS — W908XXA Exposure to other nonionizing radiation, initial encounter: Secondary | ICD-10-CM

## 2023-01-30 DIAGNOSIS — L820 Inflamed seborrheic keratosis: Secondary | ICD-10-CM

## 2023-01-30 DIAGNOSIS — Z1283 Encounter for screening for malignant neoplasm of skin: Secondary | ICD-10-CM

## 2023-01-30 DIAGNOSIS — D229 Melanocytic nevi, unspecified: Secondary | ICD-10-CM

## 2023-01-30 NOTE — Progress Notes (Signed)
   Follow-Up Visit   Subjective  Stanley Myers is a 72 y.o. male who presents for the following: Skin Cancer Screening and Full Body Skin Exam  The patient presents for Total-Body Skin Exam (TBSE) for skin cancer screening and mole check. The patient has spots, moles and lesions to be evaluated, some may be new or changing. He has a history of dysplastic nevus of the left infrapectoral, 2017.   The following portions of the chart were reviewed this encounter and updated as appropriate: medications, allergies, medical history  Review of Systems:  No other skin or systemic complaints except as noted in HPI or Assessment and Plan.  Objective  Well appearing patient in no apparent distress; mood and affect are within normal limits.  A full examination was performed including scalp, head, eyes, ears, nose, lips, neck, chest, axillae, abdomen, back, buttocks, bilateral upper extremities, bilateral lower extremities, hands, feet, fingers, toes, fingernails, and toenails. All findings within normal limits unless otherwise noted below.   Relevant physical exam findings are noted in the Assessment and Plan.  R forehead x 1, L forehead x 1 (2) Erythematous stuck-on, waxy papule or plaque    Assessment & Plan   SKIN CANCER SCREENING PERFORMED TODAY.  ACTINIC DAMAGE - Chronic condition, secondary to cumulative UV/sun exposure - diffuse scaly erythematous macules with underlying dyspigmentation - Recommend daily broad spectrum sunscreen SPF 30+ to sun-exposed areas, reapply every 2 hours as needed.  - Staying in the shade or wearing long sleeves, sun glasses (UVA+UVB protection) and wide brim hats (4-inch brim around the entire circumference of the hat) are also recommended for sun protection.  - Call for new or changing lesions.  LENTIGINES, SEBORRHEIC KERATOSES, HEMANGIOMAS - Benign normal skin lesions - Benign-appearing - Call for any changes  MELANOCYTIC NEVI - Tan-brown and/or  pink-flesh-colored symmetric macules and papules - Benign appearing on exam today - Observation - Call clinic for new or changing moles - Recommend daily use of broad spectrum spf 30+ sunscreen to sun-exposed areas.   History of Dysplastic Nevus Left infrapectoral, 2017 - No evidence of recurrence today - Recommend regular full body skin exams - Recommend daily broad spectrum sunscreen SPF 30+ to sun-exposed areas, reapply every 2 hours as needed.  - Call if any new or changing lesions are noted between office visits  Inflamed seborrheic keratosis (2) R forehead x 1, L forehead x 1  Symptomatic, irritating, patient would like treated.  Destruction of lesion - R forehead x 1, L forehead x 1 (2)  Destruction method: cryotherapy   Informed consent: discussed and consent obtained   Lesion destroyed using liquid nitrogen: Yes   Region frozen until ice ball extended beyond lesion: Yes   Outcome: patient tolerated procedure well with no complications   Post-procedure details: wound care instructions given   Additional details:  Prior to procedure, discussed risks of blister formation, small wound, skin dyspigmentation, or rare scar following cryotherapy. Recommend Vaseline ointment to treated areas while healing.   Cafe au Lait  - Tan patch of the left buttock - Genetic - Benign, observe - Call for any changes  Return in about 1 year (around 01/30/2024) for TBSE, Hx Dysplastic Nevus.  Stanley Myers, CMA, am acting as scribe for [name], MD .   Documentation: I have reviewed the above documentation for accuracy and completeness, and I agree with the above.  [name], MD

## 2023-01-30 NOTE — Patient Instructions (Signed)

## 2023-01-31 ENCOUNTER — Encounter: Payer: Self-pay | Admitting: Dermatology

## 2023-06-20 ENCOUNTER — Telehealth: Payer: Medicare PPO | Admitting: Physician Assistant

## 2023-06-20 DIAGNOSIS — R6889 Other general symptoms and signs: Secondary | ICD-10-CM | POA: Diagnosis not present

## 2023-06-20 MED ORDER — OSELTAMIVIR PHOSPHATE 75 MG PO CAPS: 75.0000 mg | ORAL_CAPSULE | Freq: Two times a day (BID) | ORAL | 0 refills | Status: AC

## 2023-06-20 NOTE — Progress Notes (Signed)
 E visit for Flu like symptoms   We are sorry that you are not feeling well.  Here is how we plan to help! Based on what you have shared with me it looks like you may have a respiratory virus that may be influenza.  Influenza or "the flu" is   an infection caused by a respiratory virus. The flu virus is highly contagious and persons who did not receive their yearly flu vaccination may "catch" the flu from close contact.  We have anti-viral medications to treat the viruses that cause this infection. They are not a "cure" and only shorten the course of the infection. These prescriptions are most effective when they are given within the first 2 days of "flu" symptoms. Antiviral medication are indicated if you have a high risk of complications from the flu. You should  also consider an antiviral medication if you are in close contact with someone who is at risk. These medications can help patients avoid complications from the flu  but have side effects that you should know. Possible side effects from Tamiflu or oseltamivir include nausea, vomiting, diarrhea, dizziness, headaches, eye redness, sleep problems or other respiratory symptoms. You should not take Tamiflu if you have an allergy to oseltamivir or any to the ingredients in Tamiflu.  Based upon your symptoms and potential risk factors I have prescribed Oseltamivir (Tamiflu).  It has been sent to your designated pharmacy.  You will take one 75 mg capsule orally twice a day for the next 5 days.  ANYONE WHO HAS FLU SYMPTOMS SHOULD: Stay home. The flu is highly contagious and going out or to work exposes others! Be sure to drink plenty of fluids. Water is fine as well as fruit juices, sodas and electrolyte beverages. You may want to stay away from caffeine or alcohol. If you are nauseated, try taking small sips of liquids. How do you know if you are getting enough fluid? Your urine should be a pale yellow or almost colorless. Get rest. Taking a steamy  shower or using a humidifier may help nasal congestion and ease sore throat pain. Using a saline nasal spray works much the same way. Cough drops, hard candies and sore throat lozenges may ease your cough. Line up a caregiver. Have someone check on you regularly.   GET HELP RIGHT AWAY IF: You cannot keep down liquids or your medications. You become short of breath Your fell like you are going to pass out or loose consciousness. Your symptoms persist after you have completed your treatment plan MAKE SURE YOU  Understand these instructions. Will watch your condition. Will get help right away if you are not doing well or get worse.  Your e-visit answers were reviewed by a board certified advanced clinical practitioner to complete your personal care plan.  Depending on the condition, your plan could have included both over the counter or prescription medications.  If there is a problem please reply  once you have received a response from your provider.  Your safety is important to [place].  If you have drug allergies check your prescription carefully.    You can use MyChart to ask questions about today's visit, request a non-urgent call back, or ask for a work or school excuse for 24 hours related to this e-Visit. If it has been greater than 24 hours you will need to follow up with your provider, or enter a new e-Visit to address those concerns.  You will get an e-mail in the next  two days asking about your experience.  I hope that your e-visit has been valuable and will speed your recovery. Thank you for using e-visits.   I have spent 5 minutes in review of e-visit questionnaire, review and updating patient chart, medical decision making and response to patient.   Tylene Fantasia Ward, PA-C

## 2024-02-04 ENCOUNTER — Encounter: Payer: Self-pay | Admitting: Dermatology

## 2024-02-04 ENCOUNTER — Ambulatory Visit: Payer: Medicare PPO | Admitting: Dermatology

## 2024-02-04 DIAGNOSIS — W908XXA Exposure to other nonionizing radiation, initial encounter: Secondary | ICD-10-CM

## 2024-02-04 DIAGNOSIS — Q825 Congenital non-neoplastic nevus: Secondary | ICD-10-CM | POA: Diagnosis not present

## 2024-02-04 DIAGNOSIS — L821 Other seborrheic keratosis: Secondary | ICD-10-CM | POA: Diagnosis not present

## 2024-02-04 DIAGNOSIS — L814 Other melanin hyperpigmentation: Secondary | ICD-10-CM

## 2024-02-04 DIAGNOSIS — Z1283 Encounter for screening for malignant neoplasm of skin: Secondary | ICD-10-CM | POA: Diagnosis not present

## 2024-02-04 DIAGNOSIS — D229 Melanocytic nevi, unspecified: Secondary | ICD-10-CM

## 2024-02-04 DIAGNOSIS — L570 Actinic keratosis: Secondary | ICD-10-CM | POA: Diagnosis not present

## 2024-02-04 DIAGNOSIS — L578 Other skin changes due to chronic exposure to nonionizing radiation: Secondary | ICD-10-CM

## 2024-02-04 DIAGNOSIS — Z86018 Personal history of other benign neoplasm: Secondary | ICD-10-CM

## 2024-02-04 DIAGNOSIS — L820 Inflamed seborrheic keratosis: Secondary | ICD-10-CM

## 2024-02-04 NOTE — Progress Notes (Signed)
 Follow-Up Visit   Subjective  Stanley Myers is a 73 y.o. male who presents for the following: Skin Cancer Screening and Full Body Skin Exam. Hx of dysplastic nevus. Hx of ISKs.   Spots on back itching. Raised, rough.   The patient presents for Total-Body Skin Exam (TBSE) for skin cancer screening and mole check. The patient has spots, moles and lesions to be evaluated, some may be new or changing. He has a history of dysplastic nevus of the left infrapectoral, 2017.  The following portions of the chart were reviewed this encounter and updated as appropriate: medications, allergies, medical history  Review of Systems:  No other skin or systemic complaints except as noted in HPI or Assessment and Plan.  Objective  Well appearing patient in no apparent distress; mood and affect are within normal limits.  A full examination was performed including scalp, head, eyes, ears, nose, lips, neck, chest, axillae, abdomen, back, buttocks, bilateral upper extremities, bilateral lower extremities, hands, feet, fingers, toes, fingernails, and toenails. All findings within normal limits unless otherwise noted below.   Relevant physical exam findings are noted in the Assessment and Plan.       L upper back x20 (20) Erythematous keratotic or waxy stuck-on papule or plaque. Left Ear helix x1 Erythematous thin papules/macules with gritty scale.    Assessment & Plan   SKIN CANCER SCREENING PERFORMED TODAY.  ACTINIC DAMAGE - Chronic condition, secondary to cumulative UV/sun exposure - diffuse scaly erythematous macules with underlying dyspigmentation - Recommend daily broad spectrum sunscreen SPF 30+ to sun-exposed areas, reapply every 2 hours as needed.  - Staying in the shade or wearing long sleeves, sun glasses (UVA+UVB protection) and wide brim hats (4-inch brim around the entire circumference of the hat) are also recommended for sun protection.  - Call for new or changing  lesions.  LENTIGINES, SEBORRHEIC KERATOSES, HEMANGIOMAS - Benign normal skin lesions - Benign-appearing - Call for any changes  MELANOCYTIC NEVI - Tan-brown and/or pink-flesh-colored symmetric macules and papules - Benign appearing on exam today - Observation - Call clinic for new or changing moles - Recommend daily use of broad spectrum spf 30+ sunscreen to sun-exposed areas.   History of Dysplastic Nevus Left infrapectoral, 2017 - No evidence of recurrence today - Recommend regular full body skin exams - Recommend daily broad spectrum sunscreen SPF 30+ to sun-exposed areas, reapply every 2 hours as needed.  - Call if any new or changing lesions are noted between office visits  CONGENITAL NEVUS Exam: regular tan plaque with hair growth at left buttock, present since birth/childhood, no changes Photo taken today.  Treatment Plan: Benign-appearing.  Stable. Observation.  Call clinic for new or changing moles.   Recommend daily use of broad spectrum spf 30+ sunscreen to sun-exposed areas.    ABCDEs of mole observation discussed and patient handout given.  RTC if any changes noted.  INFLAMED SEBORRHEIC KERATOSIS (20) L upper back x20 (20) Symptomatic, irritating, patient would like treated. Destruction of lesion - L upper back x20 (20) Complexity: simple   Destruction method: cryotherapy   Informed consent: discussed and consent obtained   Timeout:  patient name, date of birth, surgical site, and procedure verified Lesion destroyed using liquid nitrogen: Yes   Region frozen until ice ball extended beyond lesion: Yes   Outcome: patient tolerated procedure well with no complications   Post-procedure details: wound care instructions given   Additional details:  Prior to procedure, discussed risks of blister formation, small wound,  skin dyspigmentation, or rare scar following cryotherapy. Recommend Vaseline ointment to treated areas while healing.   AK (ACTINIC KERATOSIS) Left Ear  helix x1 Actinic keratoses are precancerous spots that appear secondary to cumulative UV radiation exposure/sun exposure over time. They are chronic with expected duration over 1 year. A portion of actinic keratoses will progress to squamous cell carcinoma of the skin. It is not possible to reliably predict which spots will progress to skin cancer and so treatment is recommended to prevent development of skin cancer.  Recommend daily broad spectrum sunscreen SPF 30+ to sun-exposed areas, reapply every 2 hours as needed.  Recommend staying in the shade or wearing long sleeves, sun glasses (UVA+UVB protection) and wide brim hats (4-inch brim around the entire circumference of the hat). Call for new or changing lesions. Destruction of lesion - Left Ear helix x1 Complexity: simple   Destruction method: cryotherapy   Informed consent: discussed and consent obtained   Timeout:  patient name, date of birth, surgical site, and procedure verified Lesion destroyed using liquid nitrogen: Yes   Region frozen until ice ball extended beyond lesion: Yes   Outcome: patient tolerated procedure well with no complications   Post-procedure details: wound care instructions given   Additional details:  Prior to procedure, discussed risks of blister formation, small wound, skin dyspigmentation, or rare scar following cryotherapy. Recommend Vaseline ointment to treated areas while healing.     Return in about 1 year (around 02/03/2025) for TBSE, HxDN.  I, Jill Parcell, CMA, am acting as scribe for Alm Rhyme, MD.    Documentation: I have reviewed the above documentation for accuracy and completeness, and I agree with the above.  Alm Rhyme, MD

## 2024-02-04 NOTE — Patient Instructions (Addendum)

## 2024-04-03 ENCOUNTER — Encounter: Payer: Self-pay | Admitting: Emergency Medicine

## 2024-04-03 ENCOUNTER — Ambulatory Visit: Admission: EM | Admit: 2024-04-03 | Discharge: 2024-04-03 | Disposition: A | Discharge status: Home / Self Care

## 2024-04-03 DIAGNOSIS — R051 Acute cough: Secondary | ICD-10-CM | POA: Diagnosis not present

## 2024-04-03 DIAGNOSIS — J019 Acute sinusitis, unspecified: Secondary | ICD-10-CM | POA: Diagnosis not present

## 2024-04-03 MED ORDER — AMOXICILLIN-POT CLAVULANATE 875-125 MG PO TABS: 1.0000 | ORAL_TABLET | Freq: Two times a day (BID) | ORAL | 0 refills | Status: AC

## 2024-04-03 NOTE — ED Provider Notes (Signed)
 [facility]    CSN: [id number] Arrival date & time: 04/03/24  1244      History   Chief Complaint Chief Complaint  Patient presents with   Cough   Sinus Problem    HPI Stanley Myers is a 73 y.o. male presenting for > 1 week history of nasal congestion, sinus pressure and pain.  He reports having a cough developed yesterday and is fearful his symptoms are worsening and could potentially lead to bronchitis or pneumonia.  He denies fever, sore throat, chest pain, shortness of breath, wheezing, abdominal pain, vomiting or diarrhea.  No sick contacts.  Has been using Flonase, nasal saline and his Astelin nasal spray.  HPI  Past Medical History:  Diagnosis Date   Diabetes mellitus without complication (HCC)    Dysplastic nevus 12/11/2015   Left infrapectoral. Mild atypia, lateral margin involved.   Hypercholesteremia    Hypertension     There are no active problems to display for this patient.   Past Surgical History:  Procedure Laterality Date   WISDOM TOOTH EXTRACTION         Home Medications    Prior to Admission medications   Medication Sig Start Date End Date Taking? Authorizing Provider  amoxicillin -clavulanate (AUGMENTIN ) 875-125 MG tablet Take 1 tablet by mouth every 12 (twelve) hours for 7 days. 04/03/24 04/10/24 Yes Arvis Huxley B, PA-C  metoprolol succinate (TOPROL-XL) 50 MG 24 hr tablet Take 50 mg by mouth daily. 03/16/24 03/16/25 Yes [provider]  aspirin EC 81 MG tablet Take 81 mg by mouth daily.    [provider]  Azelastine-Fluticasone 137-50 MCG/ACT SUSP Place into the nose.    [provider]  celecoxib (CELEBREX) 200 MG capsule Take 200 mg by mouth 2 (two) times daily.    [provider]  cyclobenzaprine (FLEXERIL) 10 MG tablet TAKE 1/2 TO 1 TABLET(5 TO 10 MG) BY MOUTH THREE TIMES DAILY AS NEEDED FOR MUSCLE SPASMS 11/07/20   [provider]  Dulaglutide (TRULICITY) 0.75 MG/0.5ML SOPN  Trulicity 0.75 mg/0.5 mL subcutaneous pen injector    [provider]  fenofibrate (TRICOR) 145 MG tablet Take 145 mg by mouth daily.    [provider]  glipiZIDE (GLUCOTROL XL) 5 MG 24 hr tablet Take 5 mg by mouth daily with breakfast.    [provider]  metFORMIN (GLUCOPHAGE) 1000 MG tablet Take 1,000 mg by mouth 2 (two) times daily with a meal.    [provider]  rosuvastatin (CRESTOR) 10 MG tablet Take 10 mg by mouth daily.    [provider]  traMADol-acetaminophen (ULTRACET) 37.5-325 MG tablet Take 2 tablets by mouth every 6 (six) hours as needed.    [provider]  triamcinolone (NASACORT) 55 MCG/ACT AERO nasal inhaler Place 2 sprays into the nose daily.    [provider]  valsartan-hydrochlorothiazide (DIOVAN-HCT) 160-12.5 MG tablet  09/18/19   [provider]    Family History Family History  Problem Relation Age of Onset   Heart failure Mother    Stroke Father     Social History Social History   Tobacco Use   Smoking status: Never   Smokeless tobacco: Never  Vaping Use   Vaping status: Never Used  Substance Use Topics   Alcohol use: No   Drug use: No     Allergies   Atorvastatin and Prednisone   Review of Systems Review of Systems  Constitutional:  Positive for fatigue. Negative for fever.  HENT:  Positive for congestion, rhinorrhea, sinus pressure and sinus pain. Negative for sore throat.   Respiratory:  Positive for cough. Negative for shortness of breath.   Cardiovascular:  Negative for chest pain.  Gastrointestinal:  Negative for abdominal pain, diarrhea, nausea and vomiting.  Musculoskeletal:  Negative for myalgias.  Neurological:  Negative for weakness, light-headedness and headaches.  Hematological:  Negative for adenopathy.     Physical Exam Triage Vital Signs ED Triage Vitals [04/03/24 1255]  Encounter Vitals Group     BP      Girls Systolic BP Percentile      Girls  Diastolic BP Percentile      Boys Systolic BP Percentile      Boys Diastolic BP Percentile      Pulse      Resp      Temp      Temp src      SpO2      Weight      Height      Head Circumference      Peak Flow      Pain Score 0     Pain Loc      Pain Education      Exclude from Growth Chart    No data found.  Updated Vital Signs BP 127/66 (BP Location: Left Arm)   Pulse 67   Temp 98.1 F (36.7 C) (Oral)   Resp 15   Ht 5' 9 (1.753 m)   Wt 225 lb 15.5 oz (102.5 kg)   SpO2 97%   BMI 33.37 kg/m    Physical Exam Vitals and nursing note reviewed.  Constitutional:      General: He is not in acute distress.    Appearance: Normal appearance. He is well-developed. He is not ill-appearing.  HENT:     Head: Normocephalic and atraumatic.     Nose: Congestion present.     Mouth/Throat:     Mouth: Mucous membranes are moist.     Pharynx: Oropharynx is clear.  Eyes:     General: No scleral icterus.    Conjunctiva/sclera: Conjunctivae normal.  Cardiovascular:     Rate and Rhythm: Normal rate and regular rhythm.  Pulmonary:     Effort: Pulmonary effort is normal. No respiratory distress.     Breath sounds: Normal breath sounds.  Musculoskeletal:     Cervical back: Neck supple.  Skin:    General: Skin is warm and dry.     Capillary Refill: Capillary refill takes less than 2 seconds.  Neurological:     General: No focal deficit present.     Mental Status: He is alert. Mental status is at baseline.     Motor: No weakness.     Gait: Gait normal.  Psychiatric:        Mood and Affect: Mood normal.        Behavior: Behavior normal.      UC Treatments / Results  Labs (all labs ordered are listed, but only abnormal results are displayed) Labs Reviewed - No data to display  EKG   Radiology No results found.  Procedures Procedures (including critical care time)  Medications Ordered in UC Medications - No data to display  Initial Impression / Assessment and Plan  / UC Course  I have reviewed the triage vital signs and the nursing notes.  Pertinent labs & imaging results that were available during my care of the patient were reviewed by me and considered in my medical decision making (see  chart for details).   73 year old male presents for greater than 1 week history of nasal congestion and sinus pressure.  A mildly productive cough started yesterday.  No fever or breathing difficulty.  Vitals are all stable and normal and overall well-appearing patient.  No acute distress.  On exam he has nasal congestion.  Throat is clear.  Chest clear.  Heart regular rate rhythm.  Upper respiratory infection is most likely but could be developing sinusitis.  Will treat at this time with Augmentin  given his history.  Advised patient if his symptoms are due to a virus this will not help and the symptoms will just need to run their course.  Advised to continue nasal sprays, rest and fluids.  Advised to return if fever, worsening cough, chest pain or shortness of breath.   Final Clinical Impressions(s) / UC Diagnoses   Final diagnoses:  Acute sinusitis, recurrence not specified, unspecified location  Acute cough     Discharge Instructions      - Continue with your nasal sprays.  Start antibiotics for possible developing sinus infection. - If you develop fever, have worsening cough, chest pain or any breathing issues please return for reevaluation.     ED Prescriptions     Medication Sig Dispense Auth. Provider   amoxicillin -clavulanate (AUGMENTIN ) 875-125 MG tablet Take 1 tablet by mouth every 12 (twelve) hours for 7 days. 14 tablet Evian Salguero B, PA-C      PDMP not reviewed this encounter.   Arvis Jolan NOVAK, PA-C 04/03/24 1326

## 2024-04-03 NOTE — Discharge Instructions (Addendum)
-   Continue with your nasal sprays.  Start antibiotics for possible developing sinus infection. - If you develop fever, have worsening cough, chest pain or any breathing issues please return for reevaluation.

## 2024-04-03 NOTE — ED Triage Notes (Signed)
 Patient c/o sinus congestion and drainage that started a week ago.  Patient states that now he is having cough and chest congestion.  Patient denies fevers.

## 2024-04-05 ENCOUNTER — Ambulatory Visit: Admission: EM | Admit: 2024-04-05 | Discharge: 2024-04-05 | Disposition: A | Discharge status: Home / Self Care

## 2024-04-05 ENCOUNTER — Encounter: Payer: Self-pay | Admitting: Emergency Medicine

## 2024-04-05 DIAGNOSIS — Z8679 Personal history of other diseases of the circulatory system: Secondary | ICD-10-CM | POA: Diagnosis not present

## 2024-04-05 DIAGNOSIS — R051 Acute cough: Secondary | ICD-10-CM | POA: Diagnosis not present

## 2024-04-05 DIAGNOSIS — J0100 Acute maxillary sinusitis, unspecified: Secondary | ICD-10-CM

## 2024-04-05 NOTE — ED Triage Notes (Signed)
 Pt was seen 2 days ago and is not better. He continues to have cough. He is still taking the antibiotics prescribed.

## 2024-04-05 NOTE — ED Provider Notes (Signed)
 [facility]    CSN: [id number] Arrival date & time: 04/05/24  1430      History   Chief Complaint Chief Complaint  Patient presents with   Cough    HPI Stanley Myers is a 73 y.o. male.   73 year old male pt, Stanley Myers, presents to urgent care for evaluation of persistent cough x 1 week.  Patient was seen here 2 days ago and states he is not better and is taking Augmentin  as prescribed. Pt reports he has testing at [facility] tomorrow and notified [facility]. Pt is in NAD, VSS,afebrile.  PMH: Diabetes,hypertension, hypercholesterolemia  The history is provided by the patient. No language interpreter was used.    Past Medical History:  Diagnosis Date   Diabetes mellitus without complication (HCC)    Dysplastic nevus 12/11/2015   Left infrapectoral. Mild atypia, lateral margin involved.   Hypercholesteremia    Hypertension     Patient Active Problem List   Diagnosis Date Noted   Acute maxillary sinusitis 04/05/2024   Acute cough 04/05/2024   History of hypertension 04/05/2024    Past Surgical History:  Procedure Laterality Date   WISDOM TOOTH EXTRACTION         Home Medications    Prior to Admission medications   Medication Sig Start Date End Date Taking? Authorizing Provider  amoxicillin -clavulanate (AUGMENTIN ) 875-125 MG tablet Take 1 tablet by mouth every 12 (twelve) hours for 7 days. 04/03/24 04/10/24  Arvis Jolan NOVAK, PA-C  aspirin EC 81 MG tablet Take 81 mg by mouth daily.    [provider]  Azelastine-Fluticasone 137-50 MCG/ACT SUSP Place into the nose.    [provider]  celecoxib (CELEBREX) 200 MG capsule Take 200 mg by mouth 2 (two) times daily.    [provider]  cyclobenzaprine (FLEXERIL) 10 MG tablet TAKE 1/2 TO 1 TABLET(5 TO 10 MG) BY MOUTH THREE TIMES DAILY AS NEEDED FOR MUSCLE SPASMS 11/07/20   [provider]  Dulaglutide (TRULICITY) 0.75 MG/0.5ML SOPN Trulicity 0.75 mg/0.5 mL subcutaneous pen  injector    [provider]  fenofibrate (TRICOR) 145 MG tablet Take 145 mg by mouth daily.    [provider]  glipiZIDE (GLUCOTROL XL) 5 MG 24 hr tablet Take 5 mg by mouth daily with breakfast.    [provider]  metFORMIN (GLUCOPHAGE) 1000 MG tablet Take 1,000 mg by mouth 2 (two) times daily with a meal.    [provider]  metoprolol succinate (TOPROL-XL) 50 MG 24 hr tablet Take 50 mg by mouth daily. 03/16/24 03/16/25  [provider]  rosuvastatin (CRESTOR) 10 MG tablet Take 10 mg by mouth daily.    [provider]  traMADol-acetaminophen (ULTRACET) 37.5-325 MG tablet Take 2 tablets by mouth every 6 (six) hours as needed.    [provider]  triamcinolone (NASACORT) 55 MCG/ACT AERO nasal inhaler Place 2 sprays into the nose daily.    [provider]  valsartan-hydrochlorothiazide (DIOVAN-HCT) 160-12.5 MG tablet  09/18/19   [provider]    Family History Family History  Problem Relation Age of Onset   Heart failure Mother    Stroke Father     Social History Social History   Tobacco Use   Smoking status: Never   Smokeless tobacco: Never  Vaping Use   Vaping status: Never Used  Substance Use Topics   Alcohol use: No   Drug use: No     Allergies   Atorvastatin and Prednisone  Review of Systems Review of Systems  Constitutional:  Negative for fever.  HENT:  Positive for congestion, sinus pressure and sinus pain.   Respiratory:  Positive for cough. Negative for shortness of breath, wheezing and stridor.   Cardiovascular:  Negative for chest pain and palpitations.  All other systems reviewed and are negative.    Physical Exam Triage Vital Signs ED Triage Vitals  Encounter Vitals Group     BP --      Girls Systolic BP Percentile --      Girls Diastolic BP Percentile --      Boys Systolic BP Percentile --      Boys Diastolic BP Percentile --      Pulse --      Resp --      Temp --       Temp src --      SpO2 --      Weight 04/05/24 1509 207 lb (93.9 kg)     Height --      Head Circumference --      Peak Flow --      Pain Score 04/05/24 1511 0     Pain Loc --      Pain Education --      Exclude from Growth Chart --    No data found.  Updated Vital Signs BP (!) 142/87 (BP Location: Right Arm)   Pulse 75   Temp 98.7 F (37.1 C)   Resp 16   Wt 207 lb (93.9 kg)   SpO2 97%   BMI 30.57 kg/m   Visual Acuity Right Eye Distance:   Left Eye Distance:   Bilateral Distance:    Right Eye Near:   Left Eye Near:    Bilateral Near:     Physical Exam Vitals and nursing note reviewed.  Constitutional:      General: He is not in acute distress.    Appearance: He is well-developed. He is not ill-appearing or toxic-appearing.  HENT:     Head: Normocephalic.     Right Ear: Tympanic membrane is retracted.     Left Ear: Tympanic membrane is retracted.     Nose: Mucosal edema and congestion present.     Mouth/Throat:     Mouth: Mucous membranes are moist.     Pharynx: Uvula midline.  Eyes:     General: Lids are normal.     Conjunctiva/sclera: Conjunctivae normal.     Pupils: Pupils are equal, round, and reactive to light.  Cardiovascular:     Rate and Rhythm: Normal rate and regular rhythm.     Heart sounds: Normal heart sounds.  Pulmonary:     Effort: Pulmonary effort is normal. No respiratory distress.     Breath sounds: Normal breath sounds. No decreased breath sounds or wheezing.  Abdominal:     General: There is no distension.     Palpations: Abdomen is soft.  Musculoskeletal:        General: Normal range of motion.     Cervical back: Normal range of motion.  Skin:    General: Skin is warm and dry.     Findings: No rash.  Neurological:     General: No focal deficit present.     Mental Status: He is alert and oriented to person, place, and time.     GCS: GCS eye subscore is 4. GCS verbal subscore is 5. GCS motor subscore is 6.     Cranial Nerves:  No cranial nerve  deficit.     Sensory: No sensory deficit.  Psychiatric:        Attention and Perception: Attention normal.        Mood and Affect: Mood normal.        Speech: Speech normal.        Behavior: Behavior normal. Behavior is cooperative.      UC Treatments / Results  Labs (all labs ordered are listed, but only abnormal results are displayed) Labs Reviewed - No data to display  EKG   Radiology No results found.  Procedures Procedures (including critical care time)  Medications Ordered in UC Medications - No data to display  Initial Impression / Assessment and Plan / UC Course  I have reviewed the triage vital signs and the nursing notes.  Pertinent labs & imaging results that were available during my care of the patient were reviewed by me and considered in my medical decision making (see chart for details).    Discussed exam findings and plan of care with patient, continue and finish antibiotics, it has only been 48 hours on abx, VSS, afebrile, strict go to ER precautions given(SOB,chest pain,palpitations, etc.) Patient verbalized understanding to this provider.  Ddx: Acute maxillary sinusitis, acute cough,Hx of htn, allergies, viral illness Final Clinical Impressions(s) / UC Diagnoses   Final diagnoses:  Acute maxillary sinusitis, recurrence not specified  Acute cough  History of hypertension     Discharge Instructions      Continue your Augmentin ,astelin nasal spray, allergy med and regular meds as prescribed. Follow up with PCP in 3 days if symptoms persist.Vital signs are reassurring, no fever, HR is normal, respirations are normal, oxygen saturation is normal.  If you develop chest pain,shortness of breath or worsening issues go to ER for further evaluation.     ED Prescriptions   None    PDMP not reviewed this encounter.   Aminta Loose, NP 04/05/24 1546

## 2024-04-05 NOTE — Discharge Instructions (Addendum)
 Continue your Augmentin ,astelin nasal spray, allergy med and regular meds as prescribed. Follow up with PCP in 3 days if symptoms persist.Vital signs are reassurring, no fever, HR is normal, respirations are normal, oxygen saturation is normal.  If you develop chest pain,shortness of breath or worsening issues go to ER for further evaluation.

## 2025-02-09 ENCOUNTER — Ambulatory Visit: Admitting: Dermatology
